# Patient Record
Sex: Female | Born: 1955 | Race: White | Hispanic: No | Marital: Married | Smoking: Former smoker
Health system: Southern US, Community
[De-identification: ages and names within clinical notes are randomized; demographics above are authoritative.]

## PROBLEM LIST (undated history)

## (undated) DIAGNOSIS — R011 Cardiac murmur, unspecified: Secondary | ICD-10-CM

## (undated) DIAGNOSIS — F410 Panic disorder [episodic paroxysmal anxiety] without agoraphobia: Secondary | ICD-10-CM

## (undated) DIAGNOSIS — N183 Chronic kidney disease, stage 3 unspecified: Secondary | ICD-10-CM

## (undated) DIAGNOSIS — M8580 Other specified disorders of bone density and structure, unspecified site: Secondary | ICD-10-CM

## (undated) DIAGNOSIS — E2740 Unspecified adrenocortical insufficiency: Principal | ICD-10-CM

## (undated) DIAGNOSIS — E785 Hyperlipidemia, unspecified: Secondary | ICD-10-CM

## (undated) DIAGNOSIS — K219 Gastro-esophageal reflux disease without esophagitis: Secondary | ICD-10-CM

## (undated) DIAGNOSIS — N182 Chronic kidney disease, stage 2 (mild): Secondary | ICD-10-CM

## (undated) DIAGNOSIS — J45909 Unspecified asthma, uncomplicated: Secondary | ICD-10-CM

## (undated) DIAGNOSIS — J449 Chronic obstructive pulmonary disease, unspecified: Secondary | ICD-10-CM

## (undated) DIAGNOSIS — I38 Endocarditis, valve unspecified: Secondary | ICD-10-CM

## (undated) DIAGNOSIS — F329 Major depressive disorder, single episode, unspecified: Secondary | ICD-10-CM

## (undated) DIAGNOSIS — N3281 Overactive bladder: Secondary | ICD-10-CM

## (undated) DIAGNOSIS — D649 Anemia, unspecified: Secondary | ICD-10-CM

## (undated) DIAGNOSIS — I10 Essential (primary) hypertension: Secondary | ICD-10-CM

## (undated) DIAGNOSIS — Q2730 Arteriovenous malformation, site unspecified: Secondary | ICD-10-CM

## (undated) DIAGNOSIS — F419 Anxiety disorder, unspecified: Secondary | ICD-10-CM

## (undated) DIAGNOSIS — F32A Depression, unspecified: Secondary | ICD-10-CM

## (undated) DIAGNOSIS — E7800 Pure hypercholesterolemia, unspecified: Secondary | ICD-10-CM

## (undated) DIAGNOSIS — K5792 Diverticulitis of intestine, part unspecified, without perforation or abscess without bleeding: Secondary | ICD-10-CM

## (undated) DIAGNOSIS — K449 Diaphragmatic hernia without obstruction or gangrene: Secondary | ICD-10-CM

## (undated) HISTORY — DX: Overactive bladder: N32.81

## (undated) HISTORY — DX: Chronic kidney disease, stage 3 (moderate): N18.3

## (undated) HISTORY — DX: Anxiety disorder, unspecified: F41.9

## (undated) HISTORY — DX: Panic disorder (episodic paroxysmal anxiety): F41.0

## (undated) HISTORY — DX: Depression, unspecified: F32.A

## (undated) HISTORY — DX: Other specified disorders of bone density and structure, unspecified site: M85.80

## (undated) HISTORY — DX: Cardiac murmur, unspecified: R01.1

## (undated) HISTORY — DX: Anemia, unspecified: D64.9

## (undated) HISTORY — DX: Major depressive disorder, single episode, unspecified: F32.9

## (undated) HISTORY — DX: Chronic kidney disease, stage 3 unspecified: N18.30

## (undated) HISTORY — DX: Unspecified asthma, uncomplicated: J45.909

## (undated) HISTORY — DX: Endocarditis, valve unspecified: I38

## (undated) HISTORY — DX: Diverticulitis of intestine, part unspecified, without perforation or abscess without bleeding: K57.92

## (undated) HISTORY — DX: Gastro-esophageal reflux disease without esophagitis: K21.9

## (undated) HISTORY — DX: Unspecified adrenocortical insufficiency: E27.40

## (undated) HISTORY — DX: Arteriovenous malformation, site unspecified: Q27.30

## (undated) HISTORY — DX: Diaphragmatic hernia without obstruction or gangrene: K44.9

## (undated) HISTORY — DX: Hyperlipidemia, unspecified: E78.5

## (undated) HISTORY — DX: Chronic kidney disease, stage 2 (mild): N18.2

## (undated) HISTORY — DX: Essential (primary) hypertension: I10

## (undated) HISTORY — DX: Pure hypercholesterolemia, unspecified: E78.00

---

## 1979-01-10 HISTORY — PX: TUBAL LIGATION: SHX77

## 2002-09-17 ENCOUNTER — Encounter: Payer: Self-pay | Admitting: Emergency Medicine

## 2002-09-17 ENCOUNTER — Emergency Department (HOSPITAL_COMMUNITY): Admission: EM | Admit: 2002-09-17 | Discharge: 2002-09-17 | Payer: Self-pay | Admitting: Emergency Medicine

## 2003-06-14 ENCOUNTER — Emergency Department (HOSPITAL_COMMUNITY): Admission: EM | Admit: 2003-06-14 | Discharge: 2003-06-14 | Payer: Self-pay | Admitting: Emergency Medicine

## 2003-10-13 ENCOUNTER — Emergency Department (HOSPITAL_COMMUNITY): Admission: EM | Admit: 2003-10-13 | Discharge: 2003-10-13 | Payer: Self-pay | Admitting: Emergency Medicine

## 2003-11-29 ENCOUNTER — Emergency Department: Payer: Self-pay | Admitting: Emergency Medicine

## 2003-12-03 ENCOUNTER — Emergency Department: Payer: Self-pay | Admitting: Emergency Medicine

## 2003-12-03 ENCOUNTER — Other Ambulatory Visit: Payer: Self-pay

## 2003-12-12 ENCOUNTER — Emergency Department: Payer: Self-pay | Admitting: Emergency Medicine

## 2004-01-10 HISTORY — PX: APPENDECTOMY: SHX54

## 2004-02-10 ENCOUNTER — Ambulatory Visit: Payer: Self-pay

## 2004-07-05 ENCOUNTER — Other Ambulatory Visit: Payer: Self-pay

## 2004-07-08 ENCOUNTER — Inpatient Hospital Stay: Payer: Self-pay | Admitting: Obstetrics and Gynecology

## 2004-07-15 ENCOUNTER — Ambulatory Visit: Payer: Self-pay | Admitting: Obstetrics and Gynecology

## 2004-07-16 ENCOUNTER — Inpatient Hospital Stay: Payer: Self-pay | Admitting: Obstetrics and Gynecology

## 2005-01-09 HISTORY — PX: ABDOMINAL HYSTERECTOMY: SHX81

## 2005-01-17 ENCOUNTER — Emergency Department: Payer: Self-pay | Admitting: Unknown Physician Specialty

## 2005-02-06 ENCOUNTER — Emergency Department: Payer: Self-pay | Admitting: General Practice

## 2006-01-01 IMAGING — CR DG CHEST 2V
1 series · 2 of 2 positions shown · non-contrast
Comparison: none

REASON FOR EXAM: v77.99 hx tobacco
COMMENTS:

PROCEDURE:     DXR - DXR CHEST PA (OR AP) AND LATERAL  - July 05, 2004 [DATE]
RESULT:     The current exam is compared to prior exam of 07-01-02.  The lung
fields are clear. The heart, mediastinal and osseous structures show no
significant abnormalities.

[Series 454: postero_anterior · 0.11mm/px · 2 of 2 slices shown]
[im 1/2]
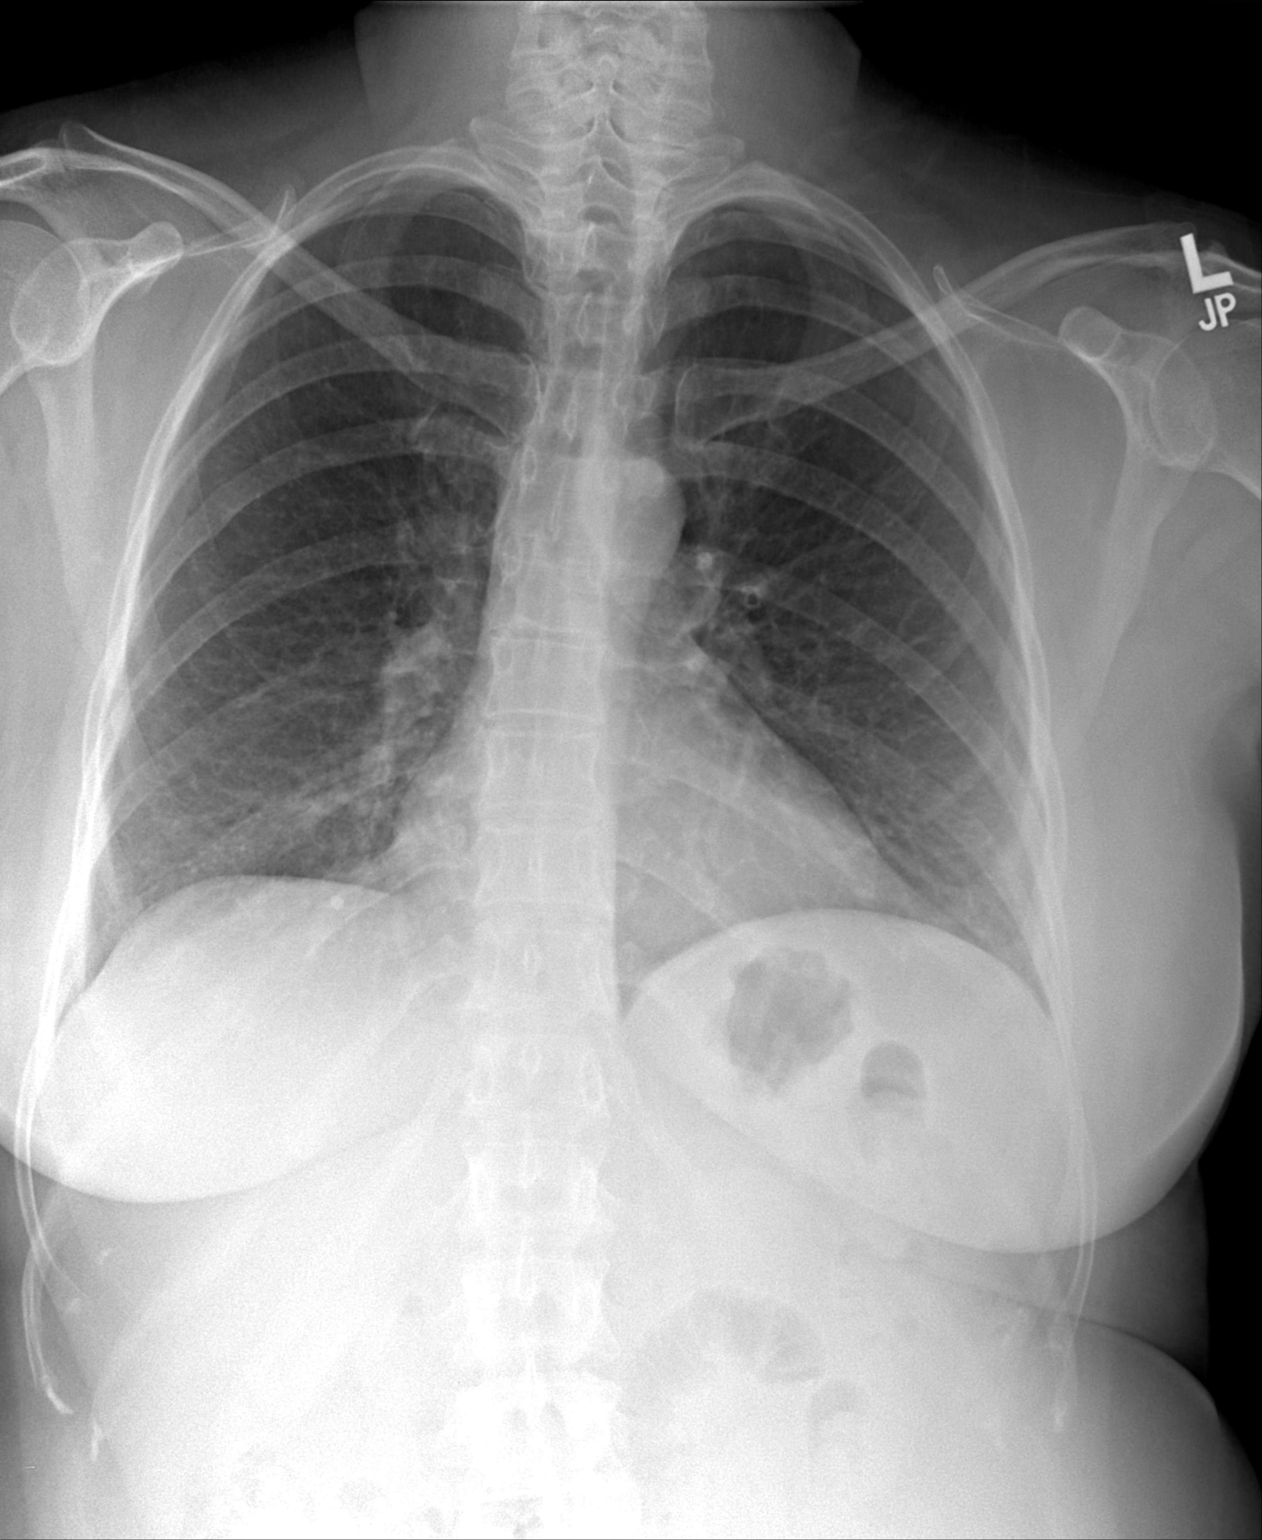
[im 2/2]
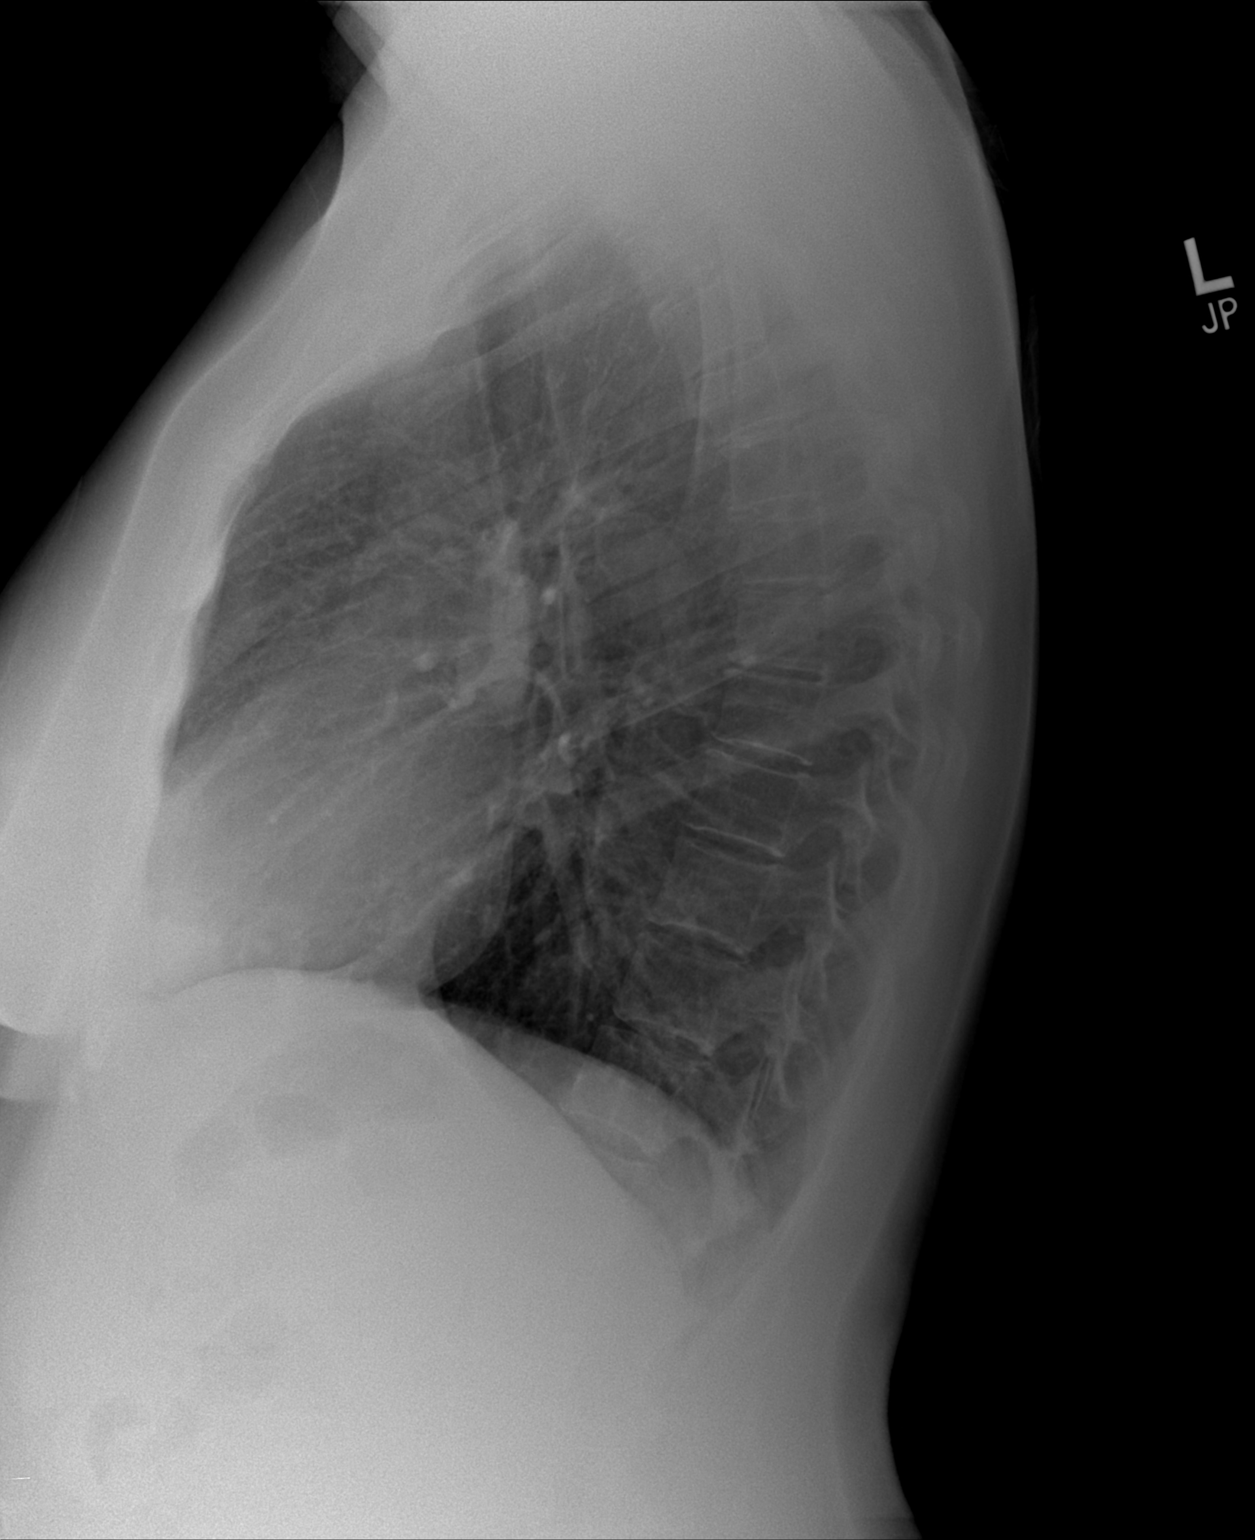

[2 of 2 positions shown; findings below may reference images not displayed]

IMPRESSION: 1)No acute changes are identified.

## 2006-06-07 ENCOUNTER — Ambulatory Visit: Payer: Self-pay | Admitting: Internal Medicine

## 2006-06-23 ENCOUNTER — Other Ambulatory Visit: Payer: Self-pay

## 2006-06-23 ENCOUNTER — Emergency Department: Payer: Self-pay | Admitting: Emergency Medicine

## 2006-08-23 ENCOUNTER — Inpatient Hospital Stay: Payer: Self-pay | Admitting: Internal Medicine

## 2006-08-24 ENCOUNTER — Other Ambulatory Visit: Payer: Self-pay

## 2006-09-04 ENCOUNTER — Ambulatory Visit: Payer: Self-pay | Admitting: Gastroenterology

## 2006-09-07 ENCOUNTER — Ambulatory Visit: Payer: Self-pay | Admitting: Gastroenterology

## 2006-09-27 ENCOUNTER — Ambulatory Visit: Payer: Self-pay | Admitting: Gastroenterology

## 2006-10-28 ENCOUNTER — Emergency Department: Payer: Self-pay | Admitting: Emergency Medicine

## 2007-08-05 ENCOUNTER — Emergency Department: Payer: Self-pay | Admitting: Emergency Medicine

## 2007-08-05 ENCOUNTER — Other Ambulatory Visit: Payer: Self-pay

## 2007-08-06 ENCOUNTER — Emergency Department: Payer: Self-pay | Admitting: Emergency Medicine

## 2008-05-01 ENCOUNTER — Emergency Department: Payer: Self-pay | Admitting: Emergency Medicine

## 2008-05-04 ENCOUNTER — Emergency Department: Payer: Self-pay | Admitting: Emergency Medicine

## 2008-05-09 ENCOUNTER — Emergency Department: Payer: Self-pay | Admitting: Emergency Medicine

## 2008-06-18 ENCOUNTER — Emergency Department: Payer: Self-pay | Admitting: Emergency Medicine

## 2008-10-23 ENCOUNTER — Emergency Department: Payer: Self-pay | Admitting: Emergency Medicine

## 2009-09-10 ENCOUNTER — Emergency Department: Payer: Self-pay | Admitting: Unknown Physician Specialty

## 2009-12-27 ENCOUNTER — Emergency Department: Payer: Self-pay | Admitting: Emergency Medicine

## 2010-04-21 IMAGING — CT CT ABD-PELV W/ CM
1 of 2 series · 15 of 32 positions shown, 19 images · IV contrast (isovue)
Comparison: 05/09/2008

REASON FOR EXAM: (1) abd pain, hx of diverticulitis; (2) abd pain, hx of
diverticulitis
COMMENTS:

PROCEDURE:     CT  - CT ABDOMEN / PELVIS  W  - October 23, 2008  [DATE]
RESULT:     History: Abdominal pain
TECHNIQUE: Multiple axial images of the abdomen and pelvis were performed
from the lung bases to the pubic symphysis, with p.o. contrast and with 100
ml of Isovue 370 intravenous contrast.

[Series 2: abdomen · axial · 0.67mm/px · z∈[-1217,-857]mm · 15 of 80 slices shown, 19 images]
[im 4/80  soft-tissue]
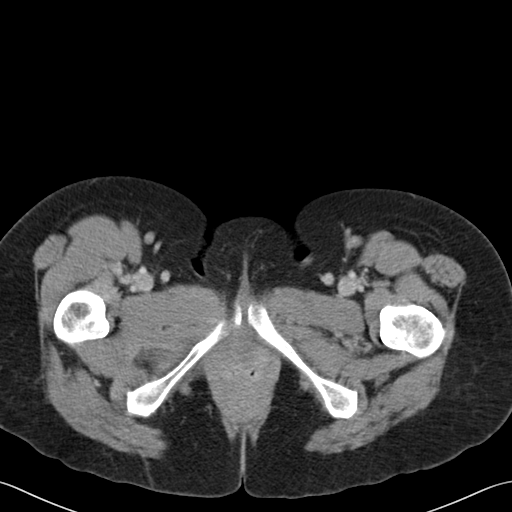
[im 4/80  bone]
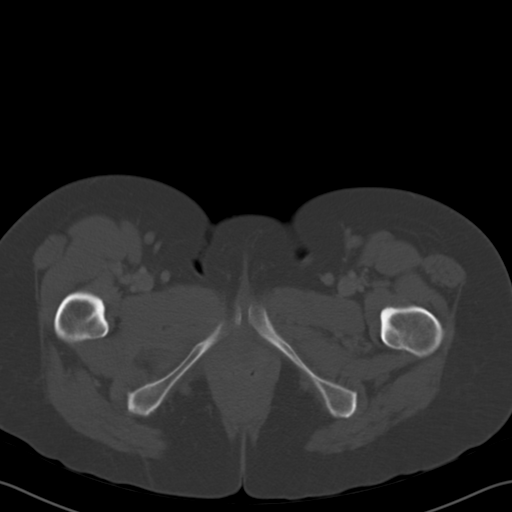
[im 10/80  soft-tissue]
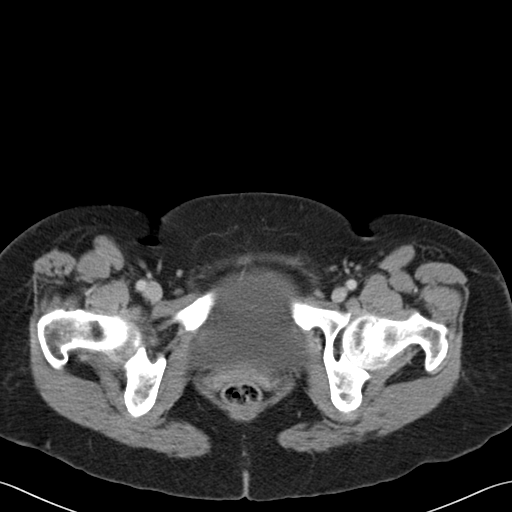
[im 17/80  soft-tissue]
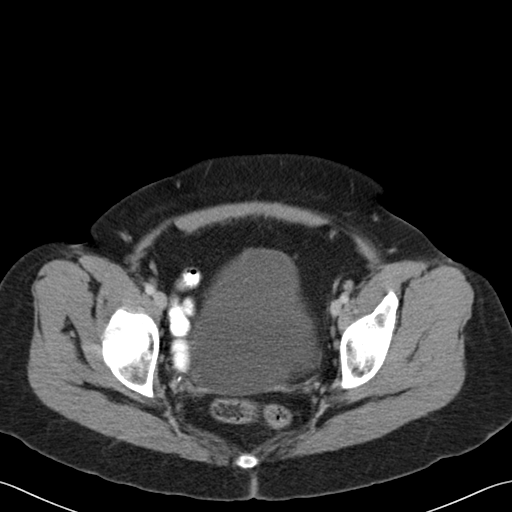
[im 24/80  soft-tissue]
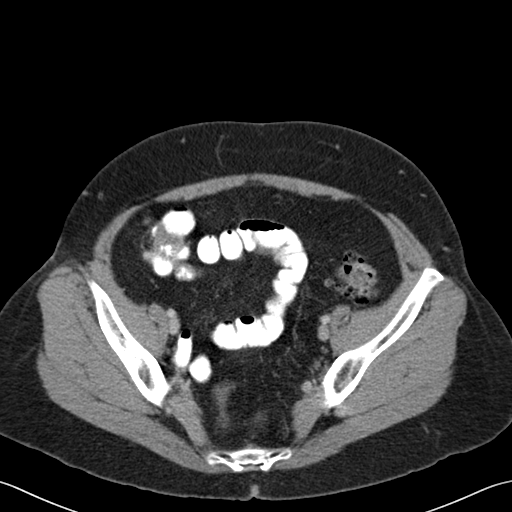
[im 27/80  soft-tissue]
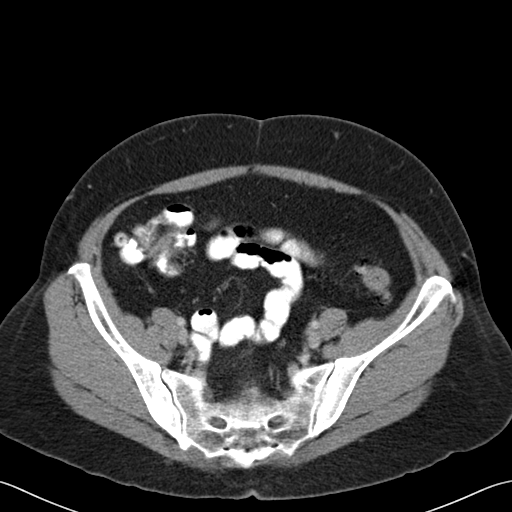
[im 33/80  soft-tissue]
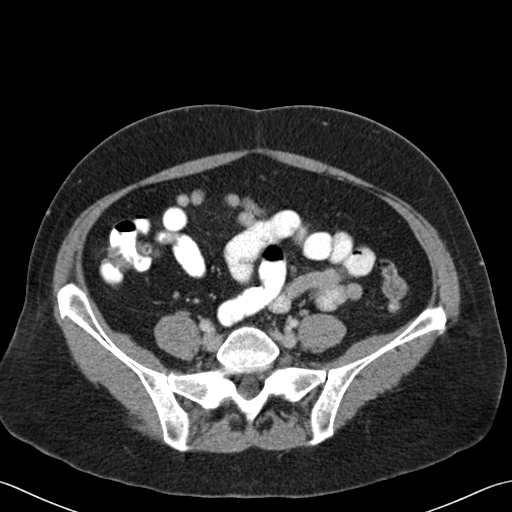
[im 40/80  soft-tissue]
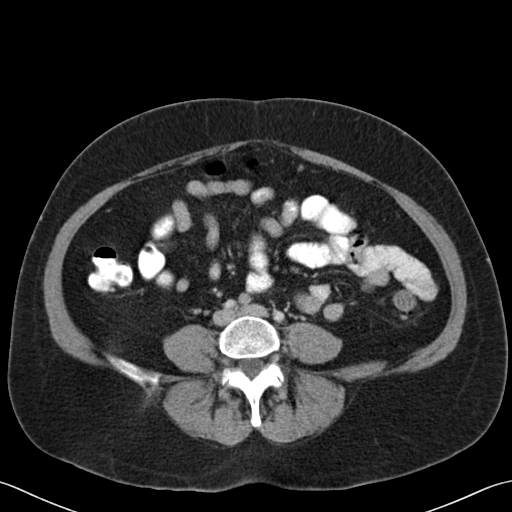
[im 47/80  soft-tissue]
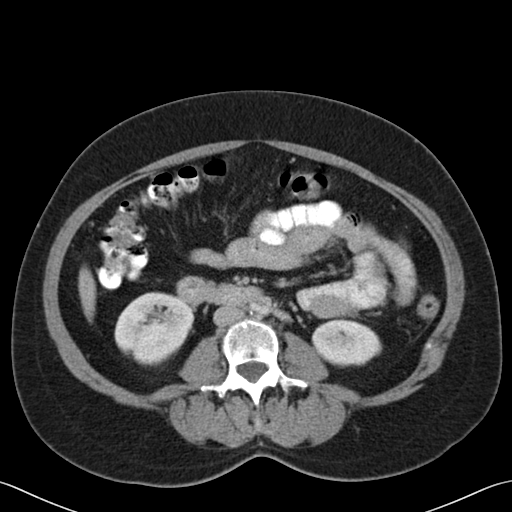
[im 53/80  soft-tissue]
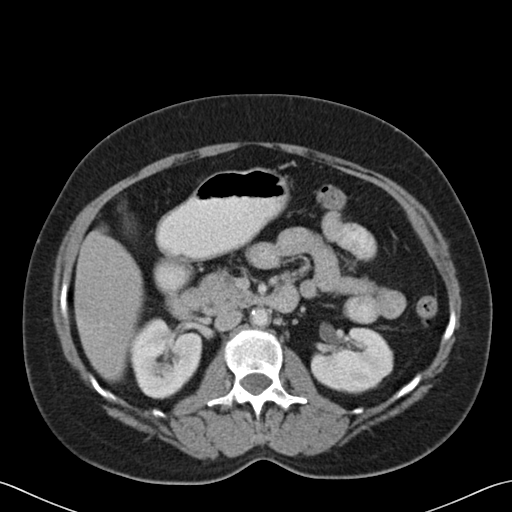
[im 53/80  bone]
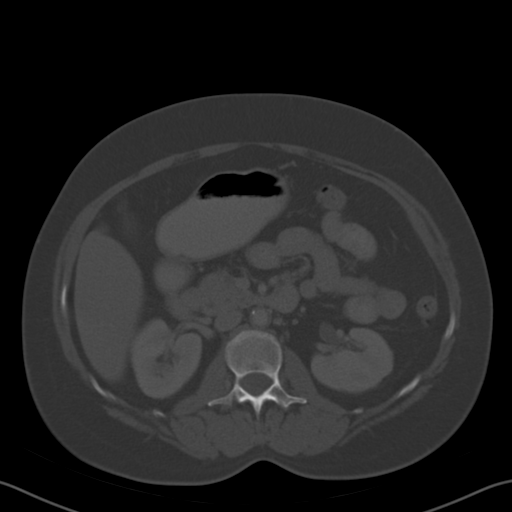
[im 56/80  soft-tissue]
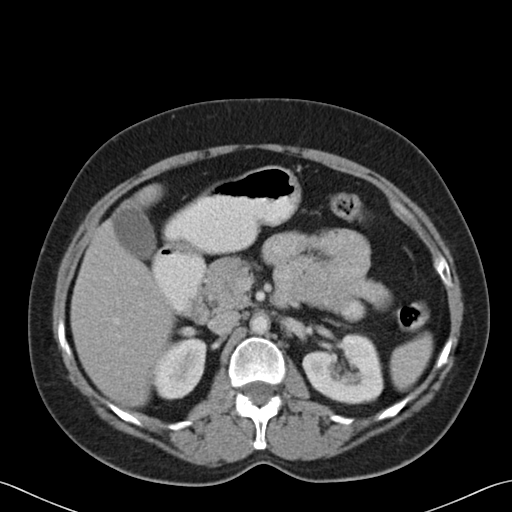
[im 63/80  soft-tissue]
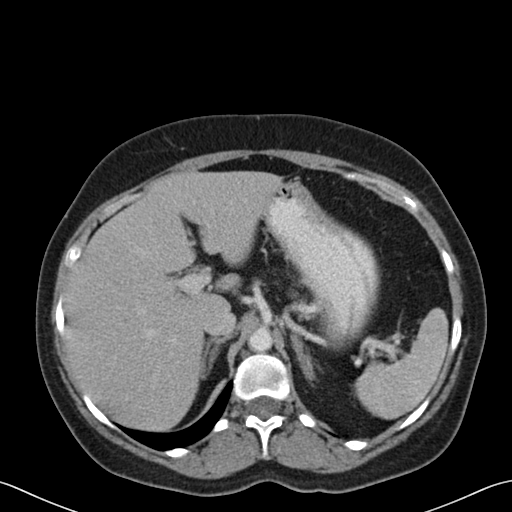
[im 66/80  lung]
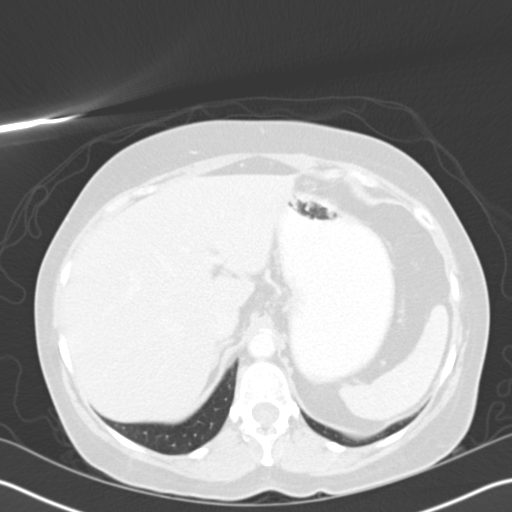
[im 70/80  soft-tissue]
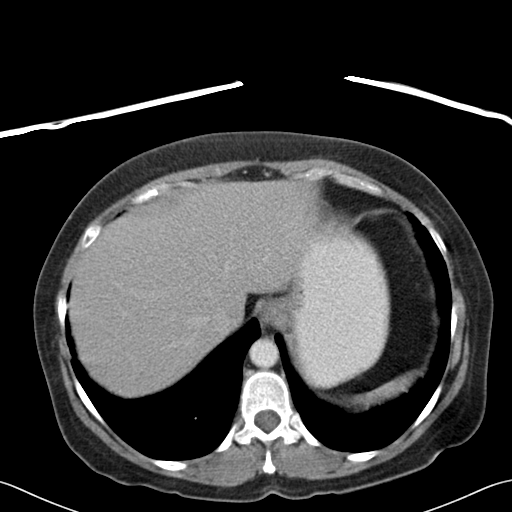
[im 70/80  lung]
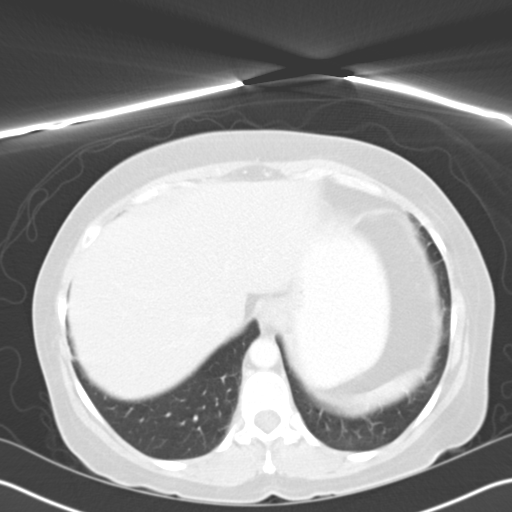
[im 73/80  lung]
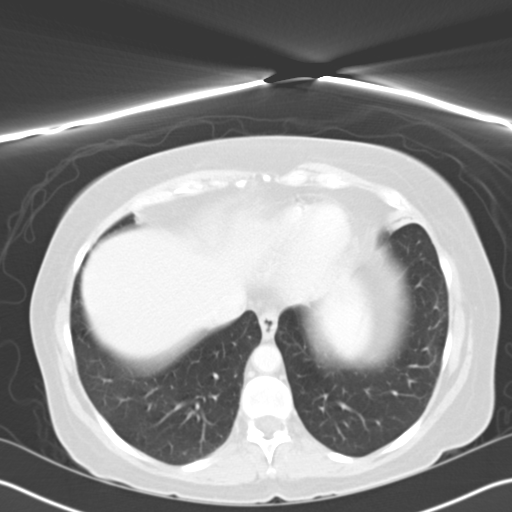
[im 76/80  soft-tissue]
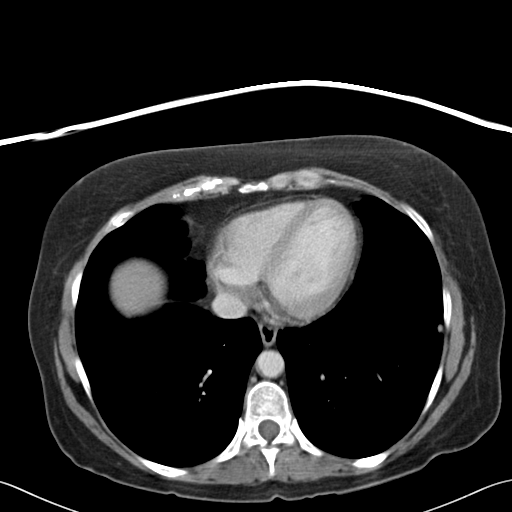
[im 76/80  lung]
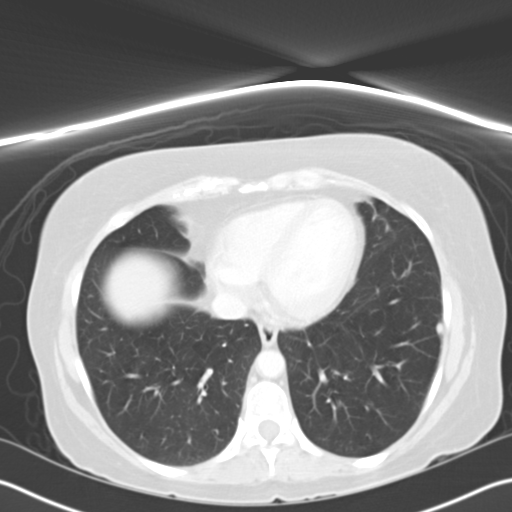

[15 of 32 positions shown; findings below may reference images not displayed]

FINDINGS: The lung bases are clear. There is no pneumothorax. The heart size is
normal.

The liver demonstrates no focal abnormality. There is no intrahepatic or
extrahepatic biliary ductal dilatation. The gallbladder is unremarkable. The
spleen demonstrates no focal abnormality. The kidneys, adrenal glands, and
pancreas are normal. The bladder is unremarkable.

The stomach, duodenum, small intestine, and large intestine demonstrate no
contrast extravasation or dilatation. There is diverticulosis of the
descending and sigmoid colon. There is a small area of subtle pericolonic
inflammatory fat stranding involving the mid descending colon. There is no
focal fluid collection to suggest an abscess. There is no pneumoperitoneum,
pneumatosis, or portal venous gas. There is no abdominal or pelvic free
fluid. There is no lymphadenopathy.

The abdominal aorta is normal in caliber with atherosclerosis.

The osseous structures are unremarkable.
IMPRESSION: Diverticulosis of the descending and sigmoid colon with a small area of
pericolonic fat stranding involving the mid descending colon most concerning
for mild diverticulitis. There is no evidence of a peridiverticular abscess.

## 2011-01-10 HISTORY — PX: COLONOSCOPY: SHX174

## 2011-01-10 HISTORY — PX: UPPER GI ENDOSCOPY: SHX6162

## 2011-01-10 LAB — HM COLONOSCOPY

## 2011-08-27 ENCOUNTER — Emergency Department: Payer: Self-pay | Admitting: Emergency Medicine

## 2011-08-27 LAB — BASIC METABOLIC PANEL
Anion Gap: 4 — ABNORMAL LOW (ref 7–16)
BUN: 17 mg/dL (ref 7–18)
Calcium, Total: 9.4 mg/dL (ref 8.5–10.1)
Chloride: 104 mmol/L (ref 98–107)
Co2: 30 mmol/L (ref 21–32)
Creatinine: 1.07 mg/dL (ref 0.60–1.30)
EGFR (African American): >60
EGFR (Non-African Amer.): 59 — ABNORMAL LOW
Glucose: 91 mg/dL (ref 65–99)
Osmolality: 277 (ref 275–301)
Potassium: 4 mmol/L (ref 3.5–5.1)
Sodium: 138 mmol/L (ref 136–145)

## 2011-08-27 LAB — CBC
HCT: 47.9 % — ABNORMAL HIGH (ref 35.0–47.0)
HGB: 15.2 g/dL (ref 12.0–16.0)
MCH: 28.3 pg (ref 26.0–34.0)
MCHC: 31.8 g/dL — ABNORMAL LOW (ref 32.0–36.0)
MCV: 89 fL (ref 80–100)
Platelet: 327 10*3/uL (ref 150–440)
RBC: 5.38 10*6/uL — ABNORMAL HIGH (ref 3.80–5.20)
RDW: 13.6 % (ref 11.5–14.5)
WBC: 11 10*3/uL (ref 3.6–11.0)

## 2011-08-27 LAB — LIPASE, BLOOD: Lipase: 268 U/L (ref 73–393)

## 2011-12-27 ENCOUNTER — Emergency Department: Payer: Self-pay | Admitting: Internal Medicine

## 2011-12-27 LAB — COMPREHENSIVE METABOLIC PANEL
Albumin: 3.2 g/dL — ABNORMAL LOW (ref 3.4–5.0)
Alkaline Phosphatase: 101 U/L (ref 50–136)
Anion Gap: 9 (ref 7–16)
BUN: 9 mg/dL (ref 7–18)
Bilirubin,Total: 0.3 mg/dL (ref 0.2–1.0)
Calcium, Total: 8.5 mg/dL (ref 8.5–10.1)
Chloride: 105 mmol/L (ref 98–107)
Co2: 23 mmol/L (ref 21–32)
Creatinine: 0.91 mg/dL (ref 0.60–1.30)
EGFR (African American): >60
EGFR (Non-African Amer.): >60
Glucose: 92 mg/dL (ref 65–99)
Osmolality: 272 (ref 275–301)
Potassium: 3.8 mmol/L (ref 3.5–5.1)
SGOT(AST): 25 U/L (ref 15–37)
SGPT (ALT): 22 U/L (ref 12–78)
Sodium: 137 mmol/L (ref 136–145)
Total Protein: 7.7 g/dL (ref 6.4–8.2)

## 2011-12-27 LAB — URINALYSIS, COMPLETE
Bacteria: NONE SEEN
Bilirubin,UR: NEGATIVE
Glucose,UR: NEGATIVE mg/dL (ref 0–75)
Ketone: NEGATIVE
Leukocyte Esterase: NEGATIVE
Nitrite: NEGATIVE
Ph: 6 (ref 4.5–8.0)
Protein: NEGATIVE
RBC,UR: <1 /HPF (ref 0–5)
Specific Gravity: 1.008 (ref 1.003–1.030)
Squamous Epithelial: 11
WBC UR: 2 /HPF (ref 0–5)

## 2011-12-27 LAB — CBC
HCT: 42.7 % (ref 35.0–47.0)
HGB: 13.8 g/dL (ref 12.0–16.0)
MCH: 28 pg (ref 26.0–34.0)
MCHC: 32.3 g/dL (ref 32.0–36.0)
MCV: 87 fL (ref 80–100)
Platelet: 248 10*3/uL (ref 150–440)
RBC: 4.93 10*6/uL (ref 3.80–5.20)
RDW: 13.9 % (ref 11.5–14.5)
WBC: 5.5 10*3/uL (ref 3.6–11.0)

## 2011-12-27 LAB — LIPASE, BLOOD: Lipase: 204 U/L (ref 73–393)

## 2011-12-29 LAB — COMPREHENSIVE METABOLIC PANEL
Albumin: 3.4 g/dL (ref 3.4–5.0)
Alkaline Phosphatase: 104 U/L (ref 50–136)
Anion Gap: 7 (ref 7–16)
BUN: 13 mg/dL (ref 7–18)
Bilirubin,Total: 0.3 mg/dL (ref 0.2–1.0)
Calcium, Total: 8.6 mg/dL (ref 8.5–10.1)
Chloride: 103 mmol/L (ref 98–107)
Co2: 29 mmol/L (ref 21–32)
Creatinine: 1.07 mg/dL (ref 0.60–1.30)
EGFR (African American): >60
EGFR (Non-African Amer.): 59 — ABNORMAL LOW
Glucose: 88 mg/dL (ref 65–99)
Osmolality: 277 (ref 275–301)
Potassium: 3.9 mmol/L (ref 3.5–5.1)
SGOT(AST): 12 U/L — ABNORMAL LOW (ref 15–37)
SGPT (ALT): 17 U/L (ref 12–78)
Sodium: 139 mmol/L (ref 136–145)
Total Protein: 8 g/dL (ref 6.4–8.2)

## 2011-12-29 LAB — CBC
HCT: 44.4 % (ref 35.0–47.0)
HGB: 15.1 g/dL (ref 12.0–16.0)
MCH: 29.4 pg (ref 26.0–34.0)
MCHC: 33.9 g/dL (ref 32.0–36.0)
MCV: 87 fL (ref 80–100)
Platelet: 250 10*3/uL (ref 150–440)
RBC: 5.12 10*6/uL (ref 3.80–5.20)
RDW: 13.6 % (ref 11.5–14.5)
WBC: 8.6 10*3/uL (ref 3.6–11.0)

## 2011-12-29 LAB — LIPASE, BLOOD: Lipase: 213 U/L (ref 73–393)

## 2011-12-30 ENCOUNTER — Inpatient Hospital Stay: Payer: Self-pay | Admitting: Internal Medicine

## 2011-12-30 LAB — RAPID INFLUENZA A&B ANTIGENS

## 2011-12-30 LAB — CLOSTRIDIUM DIFFICILE BY PCR

## 2011-12-31 LAB — CBC WITH DIFFERENTIAL/PLATELET
Basophil #: 0 10*3/uL (ref 0.0–0.1)
Basophil %: 0.3 %
Eosinophil #: 0 10*3/uL (ref 0.0–0.7)
Eosinophil %: 0 %
HCT: 42 % (ref 35.0–47.0)
HGB: 14 g/dL (ref 12.0–16.0)
Lymphocyte #: 1.3 10*3/uL (ref 1.0–3.6)
Lymphocyte %: 12.9 %
MCH: 28.8 pg (ref 26.0–34.0)
MCHC: 33.3 g/dL (ref 32.0–36.0)
MCV: 86 fL (ref 80–100)
Monocyte #: 0.4 x10 3/mm (ref 0.2–0.9)
Monocyte %: 4 %
Neutrophil #: 8.3 10*3/uL — ABNORMAL HIGH (ref 1.4–6.5)
Neutrophil %: 82.8 %
Platelet: 256 10*3/uL (ref 150–440)
RBC: 4.86 10*6/uL (ref 3.80–5.20)
RDW: 13.7 % (ref 11.5–14.5)
WBC: 10 10*3/uL (ref 3.6–11.0)

## 2012-01-02 LAB — CULTURE, BLOOD (SINGLE)

## 2012-01-04 LAB — CULTURE, BLOOD (SINGLE)

## 2012-07-21 ENCOUNTER — Emergency Department: Payer: Self-pay | Admitting: Emergency Medicine

## 2012-07-21 LAB — URINALYSIS, COMPLETE
Bacteria: NONE SEEN
Bilirubin,UR: NEGATIVE
Blood: NEGATIVE
Glucose,UR: NEGATIVE mg/dL (ref 0–75)
Ketone: NEGATIVE
Leukocyte Esterase: NEGATIVE
Nitrite: NEGATIVE
Ph: 5 (ref 4.5–8.0)
Protein: NEGATIVE
RBC,UR: 1 /HPF (ref 0–5)
Specific Gravity: 1.009 (ref 1.003–1.030)
Squamous Epithelial: 1
WBC UR: <1 /HPF (ref 0–5)

## 2012-07-21 LAB — CBC
HCT: 44.5 % (ref 35.0–47.0)
HGB: 14.7 g/dL (ref 12.0–16.0)
MCH: 27.7 pg (ref 26.0–34.0)
MCHC: 33.1 g/dL (ref 32.0–36.0)
MCV: 84 fL (ref 80–100)
Platelet: 304 10*3/uL (ref 150–440)
RBC: 5.32 10*6/uL — ABNORMAL HIGH (ref 3.80–5.20)
RDW: 14.3 % (ref 11.5–14.5)
WBC: 9.6 10*3/uL (ref 3.6–11.0)

## 2012-07-21 LAB — COMPREHENSIVE METABOLIC PANEL
Albumin: 3.6 g/dL (ref 3.4–5.0)
Alkaline Phosphatase: 132 U/L (ref 50–136)
Anion Gap: 3 — ABNORMAL LOW (ref 7–16)
BUN: 16 mg/dL (ref 7–18)
Bilirubin,Total: 0.4 mg/dL (ref 0.2–1.0)
Calcium, Total: 9.5 mg/dL (ref 8.5–10.1)
Chloride: 106 mmol/L (ref 98–107)
Co2: 30 mmol/L (ref 21–32)
Creatinine: 1.16 mg/dL (ref 0.60–1.30)
EGFR (African American): >60
EGFR (Non-African Amer.): 53 — ABNORMAL LOW
Glucose: 87 mg/dL (ref 65–99)
Osmolality: 278 (ref 275–301)
Potassium: 4.1 mmol/L (ref 3.5–5.1)
SGOT(AST): 18 U/L (ref 15–37)
SGPT (ALT): 27 U/L (ref 12–78)
Sodium: 139 mmol/L (ref 136–145)
Total Protein: 8.3 g/dL — ABNORMAL HIGH (ref 6.4–8.2)

## 2012-07-21 LAB — LIPASE, BLOOD: Lipase: 207 U/L (ref 73–393)

## 2013-06-12 ENCOUNTER — Emergency Department: Payer: Self-pay | Admitting: Emergency Medicine

## 2013-06-30 ENCOUNTER — Ambulatory Visit: Payer: Self-pay | Admitting: Podiatry

## 2013-08-14 ENCOUNTER — Emergency Department: Payer: Self-pay | Admitting: Emergency Medicine

## 2013-08-14 LAB — COMPREHENSIVE METABOLIC PANEL
ANION GAP: 10 (ref 7–16)
Albumin: 3.6 g/dL (ref 3.4–5.0)
Alkaline Phosphatase: 110 U/L
BILIRUBIN TOTAL: 0.3 mg/dL (ref 0.2–1.0)
BUN: 16 mg/dL (ref 7–18)
CO2: 28 mmol/L (ref 21–32)
Calcium, Total: 9.9 mg/dL (ref 8.5–10.1)
Chloride: 101 mmol/L (ref 98–107)
Creatinine: 1.19 mg/dL (ref 0.60–1.30)
EGFR (African American): 59 — ABNORMAL LOW
GFR CALC NON AF AMER: 51 — AB
Glucose: 94 mg/dL (ref 65–99)
OSMOLALITY: 278 (ref 275–301)
Potassium: 4.1 mmol/L (ref 3.5–5.1)
SGOT(AST): 21 U/L (ref 15–37)
SGPT (ALT): 29 U/L
SODIUM: 139 mmol/L (ref 136–145)
TOTAL PROTEIN: 8.7 g/dL — AB (ref 6.4–8.2)

## 2013-08-14 LAB — URINALYSIS, COMPLETE
Bacteria: NONE SEEN
Bilirubin,UR: NEGATIVE
Blood: NEGATIVE
GLUCOSE, UR: NEGATIVE mg/dL (ref 0–75)
Hyaline Cast: 2
Ketone: NEGATIVE
Nitrite: NEGATIVE
PROTEIN: NEGATIVE
Ph: 6 (ref 4.5–8.0)
RBC,UR: 1 /HPF (ref 0–5)
Specific Gravity: 1.01 (ref 1.003–1.030)
Squamous Epithelial: 8
WBC UR: 1 /HPF (ref 0–5)

## 2013-08-14 LAB — CBC
HCT: 47.3 % — ABNORMAL HIGH (ref 35.0–47.0)
HGB: 14.9 g/dL (ref 12.0–16.0)
MCH: 27.7 pg (ref 26.0–34.0)
MCHC: 31.6 g/dL — ABNORMAL LOW (ref 32.0–36.0)
MCV: 88 fL (ref 80–100)
Platelet: 321 10*3/uL (ref 150–440)
RBC: 5.39 10*6/uL — ABNORMAL HIGH (ref 3.80–5.20)
RDW: 14 % (ref 11.5–14.5)
WBC: 10.8 10*3/uL (ref 3.6–11.0)

## 2013-08-14 LAB — LIPASE, BLOOD: Lipase: 237 U/L (ref 73–393)

## 2013-08-14 LAB — TROPONIN I

## 2013-08-17 ENCOUNTER — Inpatient Hospital Stay: Payer: Self-pay | Admitting: Internal Medicine

## 2013-08-17 LAB — CBC WITH DIFFERENTIAL/PLATELET
BASOS ABS: 0.1 10*3/uL (ref 0.0–0.1)
BASOS PCT: 0.7 %
EOS PCT: 2.1 %
Eosinophil #: 0.2 10*3/uL (ref 0.0–0.7)
HCT: 44.8 % (ref 35.0–47.0)
HGB: 14.4 g/dL (ref 12.0–16.0)
LYMPHS ABS: 3.2 10*3/uL (ref 1.0–3.6)
LYMPHS PCT: 26.4 %
MCH: 28.1 pg (ref 26.0–34.0)
MCHC: 32.2 g/dL (ref 32.0–36.0)
MCV: 87 fL (ref 80–100)
Monocyte #: 1 x10 3/mm — ABNORMAL HIGH (ref 0.2–0.9)
Monocyte %: 8 %
Neutrophil #: 7.5 10*3/uL — ABNORMAL HIGH (ref 1.4–6.5)
Neutrophil %: 62.8 %
Platelet: 321 10*3/uL (ref 150–440)
RBC: 5.14 10*6/uL (ref 3.80–5.20)
RDW: 13.7 % (ref 11.5–14.5)
WBC: 12 10*3/uL — AB (ref 3.6–11.0)

## 2013-08-17 LAB — COMPREHENSIVE METABOLIC PANEL
ALK PHOS: 112 U/L
Albumin: 3.5 g/dL (ref 3.4–5.0)
Anion Gap: 10 (ref 7–16)
BILIRUBIN TOTAL: 0.6 mg/dL (ref 0.2–1.0)
BUN: 10 mg/dL (ref 7–18)
Calcium, Total: 9.4 mg/dL (ref 8.5–10.1)
Chloride: 102 mmol/L (ref 98–107)
Co2: 29 mmol/L (ref 21–32)
Creatinine: 1.26 mg/dL (ref 0.60–1.30)
EGFR (African American): 55 — ABNORMAL LOW
EGFR (Non-African Amer.): 48 — ABNORMAL LOW
Glucose: 103 mg/dL — ABNORMAL HIGH (ref 65–99)
Osmolality: 281 (ref 275–301)
POTASSIUM: 3.7 mmol/L (ref 3.5–5.1)
SGOT(AST): 21 U/L (ref 15–37)
SGPT (ALT): 24 U/L
Sodium: 141 mmol/L (ref 136–145)
Total Protein: 8.6 g/dL — ABNORMAL HIGH (ref 6.4–8.2)

## 2013-08-17 LAB — URINALYSIS, COMPLETE
BLOOD: NEGATIVE
Bacteria: NONE SEEN
Bilirubin,UR: NEGATIVE
GLUCOSE, UR: NEGATIVE mg/dL (ref 0–75)
Ketone: NEGATIVE
NITRITE: NEGATIVE
PH: 5 (ref 4.5–8.0)
PROTEIN: NEGATIVE
RBC,UR: 1 /HPF (ref 0–5)
Specific Gravity: 1.015 (ref 1.003–1.030)
Squamous Epithelial: 11
WBC UR: 2 /HPF (ref 0–5)

## 2013-09-16 ENCOUNTER — Encounter: Payer: Self-pay | Admitting: *Deleted

## 2013-09-30 ENCOUNTER — Ambulatory Visit (INDEPENDENT_AMBULATORY_CARE_PROVIDER_SITE_OTHER): Payer: BC Managed Care – PPO | Admitting: General Surgery

## 2013-09-30 ENCOUNTER — Encounter: Payer: Self-pay | Admitting: General Surgery

## 2013-09-30 VITALS — BP 150/90 | HR 86 | Resp 16 | Ht <= 58 in | Wt 142.0 lb

## 2013-09-30 DIAGNOSIS — K5732 Diverticulitis of large intestine without perforation or abscess without bleeding: Secondary | ICD-10-CM

## 2013-09-30 MED ORDER — NEOMYCIN SULFATE 500 MG PO TABS: 500.0000 mg | ORAL_TABLET | ORAL | Status: DC

## 2013-09-30 MED ORDER — POLYETHYLENE GLYCOL 3350 17 GM/SCOOP PO POWD: 1.0000 | Freq: Once | ORAL | Status: DC

## 2013-09-30 MED ORDER — METRONIDAZOLE 500 MG PO TABS: 500.0000 mg | ORAL_TABLET | ORAL | Status: DC

## 2013-09-30 NOTE — Patient Instructions (Addendum)
Laparoscopic Colectomy Laparoscopic colectomy is surgery to remove part or all of the large intestine (colon). This procedure is used to treat several conditions, including:  Inflammation and infection of the colon (diverticulitis).  Tumors or masses in the colon.  Inflammatory bowel disease, such as Crohn disease or ulcerative colitis. Colectomy is an option when symptoms cannot be controlled with medicines.  Bleeding from the colon that cannot be controlled by another method.  Blockage or obstruction of the colon. LET [facility] CARE PROVIDER KNOW ABOUT:  Any allergies you have.  All medicines you are taking, including vitamins, herbs, eye drops, creams, and over-the-counter medicines.  Previous problems you or members of your family have had with the use of anesthetics.  Any blood disorders you have.  Previous surgeries you have had.  Medical conditions you have. RISKS AND COMPLICATIONS Generally, this is a safe procedure. However, as with any procedure, complications can occur. Possible complications include:  Infection.  Bleeding.  Damage to other organs.  Leaking from where the colon was sewn together.  Future blockage of the small intestines from scar tissue. Another surgery may be needed to repair this. In some cases, complications such as damage to other organs or excessive bleeding may require the surgeon to convert from a laparoscopic procedure to an open procedure. This involves making a larger incision in the abdomen to perform the procedure. BEFORE THE PROCEDURE  Ask your health care provider about changing or stopping any regular medicines.  You may be prescribed an oral bowel prep. This involves drinking a large amount of medicated liquid, starting the day before your surgery. The liquid will cause you to have multiple loose stools until your stool is almost clear or light green. This cleans out your colon in preparation for the surgery.  Do not eat or  drink anything else once you have started the bowel prep, unless your health care provider tells you it is safe to do so.  You may also be given antibiotic pills to clean out your colon of bacteria. Be sure to follow the directions carefully and take the medicine at the correct time. PROCEDURE   Small monitors will be put on your body. They are used to check your heart, blood pressure, and oxygen level.  An IV access tube will be put into one of your veins. Medicine will be able to flow directly into your body through this IV tube.  You might be given a medicine to help you relax (sedative).  You will be given a medicine to make you sleep through the procedure (general anesthetic). A breathing tube may be placed into your lungs during the procedure.  A thin, flexible tube (catheter) will be placed into your bladder to collect urine.  A tube may be put in through your nose. It is called a nasogastric tube. It is used to remove stomach fluids after surgery until the intestines start working again.  Your abdomen will be filled with air so that it expands. This gives the surgeon more room to operate and makes your organs easier to see.  Several small cuts (incisions) are made in your abdomen.  A thin, lighted tube with a tiny camera on the end (laparoscope) is put through one of the small incisions. The camera on the laparoscope sends a picture to a TV screen in the operating room. This gives the surgeon a good view inside your abdomen.  Hollow tubes are put through the other small incisions in your abdomen. The tools  needed for the procedure are put through these tubes.  Clamps or staples are put on both ends of the diseased part of the colon.  The part of the intestine between the clamps or staples is removed.  If possible, the ends of the healthy colon that remain will be stitched or stapled together to allow your body to expel waste (stool).  Sometimes, the remaining colon cannot be  stitched back together. If this is the case, a colostomy is needed. For a colostomy:  An opening (stoma) to the outside of your body is made through the abdomen.  The end of the colon is brought to the opening. It is stitched to the skin.  A bag is attached to the opening. Stool will drain into this bag. The bag is removable.  The colostomy can be temporary or permanent.  The incisions from the colectomy are closed with stitches or staples. AFTER THE PROCEDURE  You will be monitored closely in a recovery area until you are stable and doing well. You will then be moved to a regular hospital room.  You will need to receive fluids through an IV tube until your bowel function has returned. This may take 1-3 days. Once your bowels are working again, you will be started on clear liquids and then advanced to solid food as tolerated.  You will be given pain medicines to control your pain. Document Released: 03/18/2002 Document Revised: 10/16/2012 Document Reviewed: 08/07/2012 [facility] Patient Information 2015 [place], [place]. This information is not intended to replace advice given to you by your health care provider. Make sure you discuss any questions you have with your health care provider.  Patient is scheduled for surgery at [facility] on 10/17/13. She will pre admit by phone on 10/07/13. She has been given instructions for bowel prep to start on 10/16/13. Miralax, Flagyl, and Neomycin prescriptions have been sent into her pharmacy. She is aware of dates and all instructions.

## 2013-09-30 NOTE — Progress Notes (Signed)
Patient ID: Kylie [name], female   DOB: [birth year], 58 y.o.   MRN: [id number]  Chief Complaint  Patient presents with  . Abdominal Pain    HPI Kylie [name] is a 58 y.o. female.  Here today for recurrent diverticulitis and possible surgery. She just finished antibiotics last month. She has been admitted several times for diverticulitis. She has had pain, nausea, vomiting and diarrhea this week as well. She states she has had 3 or 4 flare ups this year. Last year she had 3-4 flare ups and admitted at least once. When she has the pain it is left abdomen that radiates flank area. She has diarrhea, black stools and is weak.  She has been hospitalized 2-3 times over the past 3 years and has had several flare ups each year.   CT scan was in August 2015. She does say that her mother had colon cancer. She has had colonoscopy 2 yrs ago.   HPI  Past Medical History  Diagnosis Date  . Diverticulitis   . Hyperlipidemia   . GERD (gastroesophageal reflux disease)   . Hiatal hernia   . Asthma   . Murmur     Past Surgical History  Procedure Laterality Date  . Tubal ligation  1981  . Abdominal hysterectomy  2007  . Colonoscopy  2013    Dr Wandalee Ferdinand at [facility]  . Upper gi endoscopy  2013    Dr Wandalee Ferdinand at [facility]  . Appendectomy      Family History  Problem Relation Age of Onset  . Cancer Mother     colon    Social History History  Substance Use Topics  . Smoking status: Former Smoker -- 2.00 packs/day for 25 years    Types: Cigarettes    Quit date: 01/10/2011  . Smokeless tobacco: Not on file  . Alcohol Use: No    Allergies  Allergen Reactions  . Ciprofloxacin Hives  . Ivp Dye [Iodinated Diagnostic Agents] Other (See Comments)    shock  . Other Other (See Comments)    Myobetria/ dizzness  . Percocet [Oxycodone-Acetaminophen]   . Premarin [Conjugated Estrogens]   . Sulfa Antibiotics Hives  . Tequin [Gatifloxacin] Hives  . Latex Rash    Current Outpatient  Prescriptions  Medication Sig Dispense Refill  . Albuterol Sulfate (PROAIR HFA IN) Inhale into the lungs 3 (three) times daily.      Marland Kitchen ALPRAZolam (XANAX) 1 MG tablet Take 1 mg by mouth 3 (three) times daily.       . CRESTOR 10 MG tablet Take 10 mg by mouth daily.       Marland Kitchen diltiazem (CARDIZEM) 120 MG tablet Take 120 mg by mouth 2 (two) times daily.       . montelukast (SINGULAIR) 10 MG tablet Take 10 mg by mouth at bedtime.       . metroNIDAZOLE (FLAGYL) 500 MG tablet Take 1 tablet (500 mg total) by mouth See admin instructions. Take one  tablet at 6pm, then take one  tablet at 11 pm.  2 tablet  0  . neomycin (MYCIFRADIN) 500 MG tablet Take 1 tablet (500 mg total) by mouth See admin instructions. Take Two 500 mg tablets at 6 pm, then take Two 500 mg tablets at 11 pm.  4 tablet  0  . polyethylene glycol powder (GLYCOLAX/MIRALAX) powder Take 255 g by mouth once.  255 g  0   No current facility-administered medications for this visit.  Review of Systems Review of Systems  Constitutional: Negative.   Cardiovascular: Negative.   Gastrointestinal: Positive for nausea, vomiting and diarrhea. Negative for blood in stool.    Blood pressure 150/90, pulse 86, resp. rate 16, height  (1.448 m), weight 142 lb (64.411 kg).  Physical Exam Physical Exam  Constitutional: She is oriented to person, place, and time. She appears well-developed and well-nourished.  Eyes: Conjunctivae are normal. No scleral icterus.  Neck: Neck supple.  Cardiovascular: Normal rate, regular rhythm and normal heart sounds.   Pulmonary/Chest: Effort normal and breath sounds normal.  Abdominal: Soft. Normal appearance. There is no hepatomegaly. There is tenderness in the left lower quadrant. There is no guarding. No hernia.    Lymphadenopathy:    She has no cervical adenopathy.  Neurological: She is alert and oriented to person, place, and time.  Skin: Skin is warm and dry.    Data Reviewed Office notes  and labs reviewed. CT scan reviewed. Mild diverticulitis noted  Assessment    History of recurring focal diverticulitis involving the descending colon-sigmoid area. Although this is first time i am seeing her she has had documented episodes by ER, office and hospital admission.      Plan    Risk and benefits discussed in detail regarding surgery. Resection of involved area would need mobilization of splenic flexure with additional risk of injury to spleen. PT understands all of this and is willing to proceed..      Patient is scheduled for surgery at [facility] on 10/17/13. She will pre admit by phone on 10/07/13. She has been given instructions for bowel prep to start on 10/16/13. Miralax, Flagyl, and Neomycin prescriptions have been sent into her pharmacy. She is aware of dates and all instructions.    [name],[name] 09/30/2013, 8:20 PM

## 2013-10-01 ENCOUNTER — Other Ambulatory Visit: Payer: Self-pay | Admitting: General Surgery

## 2013-10-01 DIAGNOSIS — K5732 Diverticulitis of large intestine without perforation or abscess without bleeding: Secondary | ICD-10-CM

## 2013-10-13 ENCOUNTER — Telehealth: Payer: Self-pay | Admitting: *Deleted

## 2013-10-13 NOTE — Telephone Encounter (Signed)
Patient called the office to report that she has a bacterial infection in her vaginal area. She is presently taking Augmentin one teaspoon and a half twice daily. Patient reports she started on this last Monday, 10-06-13.   Patient has been asked to continue Augmentin and take last dose on Wednesday, 10-15-13. She will complete bowel prep and take other antibiotics (Flagyl and neomycin) on Thursday as previously planned. She was encouraged to eat yogurt/probiotics daily. Patient states she already does this.   This patient is scheduled for [facility] on 10-17-13 at [facility]. We will keep this as scheduled per Dr. Evette CristalSankar. Patient aware and verbalizes understanding.

## 2013-10-17 ENCOUNTER — Inpatient Hospital Stay: Payer: Self-pay | Admitting: General Surgery

## 2013-10-17 DIAGNOSIS — K5732 Diverticulitis of large intestine without perforation or abscess without bleeding: Secondary | ICD-10-CM

## 2013-10-17 DIAGNOSIS — K660 Peritoneal adhesions (postprocedural) (postinfection): Secondary | ICD-10-CM

## 2013-10-17 HISTORY — PX: SIGMOID RESECTION / RECTOPEXY: SUR1294

## 2013-10-18 LAB — BASIC METABOLIC PANEL
Anion Gap: 6 — ABNORMAL LOW (ref 7–16)
BUN: 11 mg/dL (ref 7–18)
CHLORIDE: 103 mmol/L (ref 98–107)
Calcium, Total: 7.3 mg/dL — ABNORMAL LOW (ref 8.5–10.1)
Co2: 28 mmol/L (ref 21–32)
Creatinine: 1.15 mg/dL (ref 0.60–1.30)
EGFR (Non-African Amer.): 52 — ABNORMAL LOW
Glucose: 135 mg/dL — ABNORMAL HIGH (ref 65–99)
Osmolality: 275 (ref 275–301)
Potassium: 3.9 mmol/L (ref 3.5–5.1)
SODIUM: 137 mmol/L (ref 136–145)

## 2013-10-18 LAB — CBC WITH DIFFERENTIAL/PLATELET
BASOS ABS: 0 10*3/uL (ref 0.0–0.1)
BASOS PCT: 0.2 %
EOS ABS: 0 10*3/uL (ref 0.0–0.7)
EOS PCT: 0.1 %
HCT: 36.4 % (ref 35.0–47.0)
HGB: 11.8 g/dL — ABNORMAL LOW (ref 12.0–16.0)
Lymphocyte #: 1.7 10*3/uL (ref 1.0–3.6)
Lymphocyte %: 16.3 %
MCH: 28.6 pg (ref 26.0–34.0)
MCHC: 32.5 g/dL (ref 32.0–36.0)
MCV: 88 fL (ref 80–100)
Monocyte #: 0.9 x10 3/mm (ref 0.2–0.9)
Monocyte %: 8.7 %
NEUTROS ABS: 7.7 10*3/uL — AB (ref 1.4–6.5)
NEUTROS PCT: 74.7 %
Platelet: 224 10*3/uL (ref 150–440)
RBC: 4.14 10*6/uL (ref 3.80–5.20)
RDW: 14.3 % (ref 11.5–14.5)
WBC: 10.3 10*3/uL (ref 3.6–11.0)

## 2013-10-19 LAB — BASIC METABOLIC PANEL
Anion Gap: 7 (ref 7–16)
BUN: 6 mg/dL — ABNORMAL LOW (ref 7–18)
CHLORIDE: 105 mmol/L (ref 98–107)
CO2: 27 mmol/L (ref 21–32)
CREATININE: 1.09 mg/dL (ref 0.60–1.30)
Calcium, Total: 7.4 mg/dL — ABNORMAL LOW (ref 8.5–10.1)
EGFR (Non-African Amer.): 55 — ABNORMAL LOW
GLUCOSE: 113 mg/dL — AB (ref 65–99)
Osmolality: 276 (ref 275–301)
POTASSIUM: 3.4 mmol/L — AB (ref 3.5–5.1)
SODIUM: 139 mmol/L (ref 136–145)

## 2013-10-20 ENCOUNTER — Encounter: Payer: Self-pay | Admitting: General Surgery

## 2013-10-23 ENCOUNTER — Encounter: Payer: Self-pay | Admitting: General Surgery

## 2013-10-23 LAB — PATHOLOGY REPORT

## 2013-10-29 ENCOUNTER — Encounter: Payer: Self-pay | Admitting: General Surgery

## 2013-10-29 ENCOUNTER — Ambulatory Visit (INDEPENDENT_AMBULATORY_CARE_PROVIDER_SITE_OTHER): Payer: Self-pay | Admitting: General Surgery

## 2013-10-29 VITALS — BP 144/80 | HR 78 | Resp 14 | Wt 134.0 lb

## 2013-10-29 DIAGNOSIS — K5732 Diverticulitis of large intestine without perforation or abscess without bleeding: Secondary | ICD-10-CM

## 2013-10-29 NOTE — Patient Instructions (Signed)
Follow up appointment to be announced.  

## 2013-10-29 NOTE — Progress Notes (Signed)
Patient ID: Kylie HoseBarbara A Krenzer, female   DOB: 09/15/1955, 58 y.o.   MRN: [id number]  Chief Complaint  Patient presents with  . Routine Post Op    sigmoid resection    HPI Kylie HoseBarbara A [name] is a 58 y.o. female here today for her post op lap sigmoid resection done on 10/17/13. Patient states she still feels weak and is having lots of diarrhea after each meal.  No fever, no n/v. HPI  Past Medical History  Diagnosis Date  . Diverticulitis   . Hyperlipidemia   . GERD (gastroesophageal reflux disease)   . Hiatal hernia   . Asthma   . Murmur     Past Surgical History  Procedure Laterality Date  . Tubal ligation  1981  . Abdominal hysterectomy  2007  . Colonoscopy  2013    [facility]  . Upper gi endoscopy  2013    [facility]  . Appendectomy    . Sigmoid resection / rectopexy  10/17/13    Family History  Problem Relation Age of Onset  . Cancer Mother     colon    Social History History  Substance Use Topics  . Smoking status: Former Smoker -- 2.00 packs/day for 25 years    Types: Cigarettes    Quit date: 01/10/2011  . Smokeless tobacco: Not on file  . Alcohol Use: No    Allergies  Allergen Reactions  . Ciprofloxacin Hives  . Ivp Dye [Iodinated Diagnostic Agents] Other (See Comments)    shock  . Other Other (See Comments)    Myobetria/ dizzness  . Percocet [Oxycodone-Acetaminophen]   . Premarin [Conjugated Estrogens]   . Sulfa Antibiotics Hives  . Tequin [Gatifloxacin] Hives  . Latex Rash    Current Outpatient Prescriptions  Medication Sig Dispense Refill  . Albuterol Sulfate (PROAIR HFA IN) Inhale into the lungs 3 (three) times daily.      Marland Kitchen. ALPRAZolam (XANAX) 1 MG tablet Take 1 mg by mouth 3 (three) times daily.       . CRESTOR 10 MG tablet Take 10 mg by mouth daily.       Marland Kitchen. diltiazem (CARDIZEM) 120 MG tablet Take 120 mg by mouth 2 (two) times daily.       . metroNIDAZOLE (FLAGYL) 500 MG tablet Take 1 tablet (500 mg total) by mouth See  admin instructions. Take one 500mg  tablet at 6pm, then take one 500mg  tablet at 11 pm.  2 tablet  0  . montelukast (SINGULAIR) 10 MG tablet Take 10 mg by mouth at bedtime.       Marland Kitchen. neomycin (MYCIFRADIN) 500 MG tablet Take 1 tablet (500 mg total) by mouth See admin instructions. Take Two 500 mg tablets at 6 pm, then take Two 500 mg tablets at 11 pm.  4 tablet  0  . polyethylene glycol powder (GLYCOLAX/MIRALAX) powder Take 255 g by mouth once.  255 g  0   No current facility-administered medications for this visit.    Review of Systems Review of Systems  Constitutional: Positive for chills. Negative for fever, diaphoresis, activity change, appetite change and fatigue.  Gastrointestinal: Positive for diarrhea.    Blood pressure 144/80, pulse 78, resp. rate 14, weight 134 lb (60.782 kg).  Physical Exam Physical Exam  Constitutional: She appears well-developed and well-nourished.  Eyes: Conjunctivae are normal. No scleral icterus.  Cardiovascular: Normal rate, regular rhythm and normal heart sounds.   Pulmonary/Chest: Effort normal and breath sounds  normal.  Abdominal: Soft. Normal appearance and bowel sounds are normal. There is tenderness (mild, non focal).  Incision looks clean and healing well. Staples removed.    Data Reviewed Path- diverticulitis.   Assessment    Staplse removed. No sign of of infection. Diarrhea need to be checked.     Plan   C.Diff , CBC ordered.  F/U after  i get these labs        [facility] LLC[facility]AR,[name] 10/29/2013, 3:47 PM

## 2013-10-30 LAB — CBC WITH DIFFERENTIAL
Basophils Absolute: 0 10*3/uL (ref 0.0–0.2)
Basos: 0 %
Eos: 3 %
Eosinophils Absolute: 0.4 10*3/uL (ref 0.0–0.4)
HCT: 33.4 % — ABNORMAL LOW (ref 34.0–46.6)
Hemoglobin: 10.9 g/dL — ABNORMAL LOW (ref 11.1–15.9)
Immature Grans (Abs): 0 10*3/uL (ref 0.0–0.1)
Immature Granulocytes: 0 %
Lymphocytes Absolute: 2.1 10*3/uL (ref 0.7–3.1)
Lymphs: 19 %
MCH: 27.5 pg (ref 26.6–33.0)
MCHC: 32.6 g/dL (ref 31.5–35.7)
MCV: 84 fL (ref 79–97)
Monocytes Absolute: 0.8 10*3/uL (ref 0.1–0.9)
Monocytes: 7 %
Neutrophils Absolute: 7.8 10*3/uL — ABNORMAL HIGH (ref 1.4–7.0)
Neutrophils Relative %: 71 %
Platelets: 631 10*3/uL — ABNORMAL HIGH (ref 150–379)
RBC: 3.97 x10E6/uL (ref 3.77–5.28)
RDW: 14.3 % (ref 12.3–15.4)
WBC: 11.2 10*3/uL — ABNORMAL HIGH (ref 3.4–10.8)

## 2013-11-07 ENCOUNTER — Telehealth: Payer: Self-pay | Admitting: *Deleted

## 2013-11-07 NOTE — Telephone Encounter (Signed)
Patient want to know her Lab work results.

## 2013-11-10 ENCOUNTER — Encounter: Payer: Self-pay | Admitting: General Surgery

## 2013-11-15 LAB — C DIFFICILE, CYTOTOXIN B

## 2013-11-15 LAB — C DIFFICILE TOXINS A+B W/RFLX: C difficile Toxins A+B, EIA: NEGATIVE

## 2013-12-18 ENCOUNTER — Ambulatory Visit: Payer: BC Managed Care – PPO | Admitting: General Surgery

## 2014-01-27 ENCOUNTER — Encounter: Payer: Self-pay | Admitting: General Surgery

## 2014-01-27 ENCOUNTER — Ambulatory Visit (INDEPENDENT_AMBULATORY_CARE_PROVIDER_SITE_OTHER): Payer: Self-pay | Admitting: General Surgery

## 2014-01-27 VITALS — BP 114/78 | HR 86 | Resp 12 | Ht <= 58 in | Wt 138.0 lb

## 2014-01-27 DIAGNOSIS — K589 Irritable bowel syndrome without diarrhea: Secondary | ICD-10-CM

## 2014-01-27 DIAGNOSIS — K5732 Diverticulitis of large intestine without perforation or abscess without bleeding: Secondary | ICD-10-CM

## 2014-01-27 NOTE — Progress Notes (Signed)
Patient ID: Kylie [name], female   DOB: 04/09/1955, 59 y.o.   MRN: [id number]  Chief Complaint  Patient presents with  . Follow-up    HPI Kylie [name] is a 59 y.o. female.  Here today for follow up from colon resection 10-17-13. She states she is having constatnt abdominal pain near the incision site. States it feels like a"scraping " pain. She has had random vomiting episodes with nausea. She does admit to on/off diarrhea vs constipation as well. The diarrhea is described more as loose stools about 3-4 times a a day.  HPI  Past Medical History  Diagnosis Date  . Diverticulitis   . Hyperlipidemia   . GERD (gastroesophageal reflux disease)   . Hiatal hernia   . Asthma   . Murmur     Past Surgical History  Procedure Laterality Date  . Tubal ligation  1981  . Abdominal hysterectomy  2007  . Colonoscopy  2013    Dr Wandalee Ferdinand at [facility]  . Upper gi endoscopy  2013    Dr Wandalee Ferdinand at [facility]  . Appendectomy    . Sigmoid resection / rectopexy  10/17/13    Family History  Problem Relation Age of Onset  . Cancer Mother     colon    Social History History  Substance Use Topics  . Smoking status: Former Smoker -- 2.00 packs/day for 25 years    Types: Cigarettes    Quit date: 01/10/2011  . Smokeless tobacco: Not on file  . Alcohol Use: No    Allergies  Allergen Reactions  . Ciprofloxacin Hives  . Ivp Dye [Iodinated Diagnostic Agents] Other (See Comments)    shock  . Other Other (See Comments)    Myobetria/ dizzness  . Percocet [Oxycodone-Acetaminophen]   . Premarin [Conjugated Estrogens]   . Sulfa Antibiotics Hives  . Tequin [Gatifloxacin] Hives  . Latex Rash    Current Outpatient Prescriptions  Medication Sig Dispense Refill  . Albuterol Sulfate (PROAIR HFA IN) Inhale into the lungs 3 (three) times daily.    Marland Kitchen ALPRAZolam (XANAX) 1 MG tablet Take 1 mg by mouth 3 (three) times daily.     . montelukast (SINGULAIR) 10 MG tablet Take 10 mg by mouth at bedtime.       No current facility-administered medications for this visit.    Review of Systems Review of Systems  Constitutional: Negative.   Respiratory: Negative.   Cardiovascular: Negative.   Gastrointestinal: Positive for nausea, vomiting, abdominal pain, diarrhea and constipation.    Blood pressure 114/78, pulse 86, resp. rate 12, height  (1.448 m), weight 138 lb (62.596 kg).  Physical Exam Physical Exam  Constitutional: She is oriented to person, place, and time. She appears well-developed and well-nourished.  Eyes: Conjunctivae are normal. No scleral icterus.  Neck: Neck supple.  Cardiovascular: Normal rate, regular rhythm and normal heart sounds.   Pulmonary/Chest: Effort normal and breath sounds normal.  Abdominal: Soft. Normal appearance and bowel sounds are normal. There is tenderness. No hernia.  Mild tenderness to left of incision, no guarding and no hernia.  Lymphadenopathy:    She has no cervical adenopathy.  Neurological: She is alert and oriented to person, place, and time.  Skin: Skin is warm and dry.    Data Reviewed Labs and office notes.  Assessment    Stable physical exam.Pt with ongoing symptoms suggesting IBS, possible adhesions( these were noted at time of surgery)    Plan    Recommend  Miralax daily to help regulate bowel movements. If this works it may alleviate a lot of her abd symptoms.  Follow up in 2 months.         [name],[name] 01/27/2014, 3:22 PM

## 2014-01-27 NOTE — Patient Instructions (Addendum)
The patient is aware to call back for any questions or concerns. Recommend Miralax daily to help regulate bowel movements.

## 2014-03-10 DIAGNOSIS — Q2730 Arteriovenous malformation, site unspecified: Secondary | ICD-10-CM

## 2014-03-10 HISTORY — DX: Arteriovenous malformation, site unspecified: Q27.30

## 2014-03-16 DIAGNOSIS — K5732 Diverticulitis of large intestine without perforation or abscess without bleeding: Secondary | ICD-10-CM

## 2014-03-18 ENCOUNTER — Ambulatory Visit: Payer: Self-pay | Admitting: General Surgery

## 2014-04-02 ENCOUNTER — Ambulatory Visit: Payer: Self-pay | Admitting: Family Medicine

## 2014-04-07 ENCOUNTER — Ambulatory Visit: Payer: BLUE CROSS/BLUE SHIELD | Admitting: General Surgery

## 2014-04-13 ENCOUNTER — Ambulatory Visit
Admit: 2014-04-13 | Disposition: A | Discharge status: Home / Self Care | Payer: Self-pay | Attending: Vascular Surgery | Admitting: Vascular Surgery

## 2014-04-13 LAB — BUN: BUN: 28 mg/dL — AB

## 2014-04-13 LAB — CREATININE, SERUM
CREATININE: 0.96 mg/dL
EGFR (African American): >60

## 2014-04-28 NOTE — H&P (Signed)
PATIENT NAME:  Kylie [name], Shiquita A MR#:  [id number] DATE OF BIRTH:  Jun 17, 1955  PRIMARY CARE PHYSICIAN: [facility] [place].   CODE STATUS: Full code.   CHIEF COMPLAINT: Shortness of breath and cough.   HISTORY OF PRESENT ILLNESS:  The patient is a 59 year old female with history of IBS, COPD who presents with cough and shortness of breath for about 1 week's duration. The patient developed a cough about 8 days ago associated with shortness of breath and pleuritic chest pain and fever.  She notes a fever of 103. She presented to the Emergency Department 12/18 which is 3 days ago and was treated for concern of right lower lobe pneumonia with Rocephin, nebulizers, Solu-Medrol. She was then subsequently discharged with amoxicillin. After discharge she continued to have symptoms including diffuse body aches, cough, shortness of breath so she returned to the Emergency Department. The x-ray from today's evaluation does not show pneumonia. However, initial evaluation noted the patient to be hypoxic with O2 sats in the 80s, 81 at one point.  Of note, when she presented in the ED on 12/18 her O2 sat ranged between 90% to 91%.  Also on physical examination she had decreased air movement. She has now received 2 DuoNebs treatments, Solu-Medrol, azithromycin and supplemental oxygen with improvement of her pulmonary exam.  However, we are being called for admission for COPD exacerbation.  In addition, over this same period of time the patient notes that she has been having nausea, vomiting and diarrhea. Since discharge she has been unable to keep any of her medications down including the prednisone and antibiotics that she was discharged with.  She characterizes the diarrhea as watery, nonbloody, nonmucousy stools that are foul-swelling in nature. She notes that she has had about 10 bowel movements today day.  Of note, the patient quit smoking 1 week ago,  Prior to that she has at least a 20 pack-year history.  The  history is obtained from ED staff, the patient, as well as review of past medical records.   PAST MEDICAL HISTORY:  1.  COPD. 2.  Hypertension.  3.  Diverticulosis.  4.  Colitis.  5.  IBS. 6.  History of a heart murmur.   history.   PAST SURGICAL HISTORY: 1.  Tubal ligation.  2.  Total abdominal hysterectomy.  3.  Exploratory laparoscopy.   MEDICATIONS: 1.  Albuterol inhaler 1 puff inhaled 4 times a day.  2.  Alprazolam 1 mg twice a day.  3.  Amoxicillin tablet daily.  4.  Diltiazem 120 mg twice a day.  5.  Promethazine 25 mg rectal suppository every 6 hours.  6.  Singulair 10 mg daily. 7.  Zithromax Z-Pak.   FAMILY HISTORY: Mother deceased from colon cancer at age 57. Father deceased of accidental death from a fall. He did have valvular heart disease, IBS and diverticulosis.   SOCIAL HISTORY: The patient smoked at least 1 pack per day for approximately 20 years. She quit 1 week ago. She denies alcohol use. She is unemployed currently.   REVIEW OF SYSTEMS: CONSTITUTIONAL: Admits to fever, malaise and generalized fatigue and weakness.  EYES: No double vision, eye pain.  ENT: No tinnitus, ear pain, hearing loss.  RESPIRATORY: Admits to cough, shortness of breath and pleuritic chest pain.  CARDIOVASCULAR: Denies orthopnea, edema or palpitations.  GASTROINTESTINAL: Admits to nausea, vomiting and diarrhea. Denies melena or rectal bleeding.  GENITOURINARY: Denies frequency or dysuria.  ENDOCRINE: Admits to increased sweats. INTEGUMENT:  Did have skin  rash on initial presentation that is resolved.  MUSCULOSKELETAL: Admits to myalgias. Denies arthralgias or joint swelling.  NEUROLOGICAL: Denies numbness, tingling, dysarthria or headaches.  PSYCH: History of anxiety. Denies depression.   PHYSICAL EXAMINATION:  VITAL SIGNS: Temperature 97.9, pulse 94, blood pressure 145/70, respirations 20, sating 92% on nasal cannula.  GENERAL: Ill appearing Caucasian female in no respiratory  distress.  PSYCHIATRIC: Awake, alert, oriented x 3.  Judgment and insight are intact.  EYES: Pupils are equally round and reactive to light and accommodation. Anicteric sclerae, normal lids.  ENT: Normal external ears and nares. Posterior oropharynx is clear without erythema, exudate or thrush.  NECK: Supple. There is no thyromegaly.  LYMPHATICS: There is a 0.5 mm posterior auricular cervical lymph node that is well circumscribed and freely mobile. No other lymphadenopathy is identified in the cervical, inguinal or axillary regions.  CARDIOVASCULAR: S1, S2, regular rate and rhythm. There is a no pretibial edema.  PULMONARY: Normal effort currently. Clear to auscultation bilaterally at this point. Initial exam showed that she has decreased air movement diffusely. ABDOMEN: There is diffuse abdominal tenderness on mild palpation. No organomegaly appreciated.  SKIN: Warm and dry. No rash.  EXTREMITIES: Full range of motion in bilateral upper and lower extremities with no clubbing or cyanosis appreciated.   LABORATORY DATA: Glucose 88, BUN 13, creatinine 1.07, sodium 139, potassium 3.9, chloride 103, bicarbonate 29, calcium 8.6, bilirubin 0.3, alkaline phosphatase 104, ALT 17, AST 12, total protein 8, albumin 3.4, osmolality 277, anion gap 7.  White count 8.6, hemoglobin 15.1, hematocrit 44.4, platelets 250 with an MCV of 87.  Lipase is 213.  Blood cultures from December 18 when she last presented showed no growth at 48 hours for 2 sets. Urinalysis on the 18th was negative.  White count on the 18th was 5.5 as well.    CHEST X-RAY: No acute disease of the chest.   ASSESSMENT AND PLAN: This is a 59 year old female presenting with cough, shortness of breath, hypoxia consistent with underlying chronic obstructive pulmonary disease history consistent with chronic obstructive pulmonary disease exacerbation with acute hypoxic respiratory failure. The patient also has diarrhea.  1.  Chronic obstructive pulmonary  disease exacerbation.  We will continue DuoNebs, Solu-Medrol, Zithromax for anti-inflammatory properties. Continue supplemental oxygen. Anticipate the patient will improve slowly.  2.  Acute hypoxic respiratory failure, most likely due to chronic obstructive pulmonary disease exacerbation.  Continue supplemental oxygen.   3.  Diarrhea. The patient does have an underlying history of irritable bowel syndrome and when I go back to review, she has had colonoscopies for chronic diarrhea so I am unsure if this is an acute exacerbation of her irritable bowel syndrome versus new infectious process, although I doubt infection given that she does not have leukocytosis at this point. We will rule her out for community acquired Clostridium difficile by checking Clostridium difficile PCR. If negative, then we can start symptomatic management.  4.  Hypertension, well controlled.  5.  CODE STATUS: Full code.   DISPOSITION: The patient is being admitted inpatient for evaluation and management of acute hypoxic respiratory failure due to acute chronic obstructive pulmonary disease exacerbation as well as diarrhea. I anticipate she will require 2 hospital midnights for evaluation and management.   Time Spent coordinating admission: 45 minutes.   ____________________________ Aurther Loft, DO aeo:ct D: 12/30/2011 06:22:13 ET T: 12/30/2011 12:16:44 ET JOB#: [id number]  cc: Aurther Loft, DO, <Dictator> Javontay Vandam E Legacie Dillingham DO ELECTRONICALLY SIGNED 01/08/2012 12:34

## 2014-04-28 NOTE — Discharge Summary (Signed)
PATIENT NAME:  Kylie [name], Kylie [name] MR#:  [id number] DATE OF BIRTH:  02-01-1955  DATE OF ADMISSION:  12/30/2011 DATE OF DISCHARGE:  12/31/2011  PRIMARY CARE PHYSICIAN: Dr. Dossie Arbourrissman.  DISCHARGE DIAGNOSES:  1. Acute hypoxia respiratory failure.  2. Chronic obstructive pulmonary disease exacerbation.  3. Diarrhea.  4. Hypertension.  5. Tobacco abuse.   CONDITION: Stable.   CODE STATUS: Full code.  MEDICATIONS:  1. Singulair 10 mg once daily.  2. Alprazolam 1 mg p.o. b.i.d. 3. Diltiazem 120 mg p.o. b.i.d. 4. Albuterol CF-free 90 mcg inhalation one puff 4 times [name] day p.r.n. for shortness of breath. 5. Zithromax 500 mg p.o. tablets 1 tablet once daily for 3 days.  6. Prednisone 40 mg p.o. 2 days, 20 mg p.o. 2 days, then 10 mg p.o. 2 days.   DIET: Low sodium diet.   ACTIVITY: As tolerated.   FOLLOW-UP CARE: Follow up with PCP within 1 to 2 weeks. In addition, the patient was counseled for smoking cessation.   REASON FOR ADMISSION: Shortness of breath and cough.   HOSPITAL COURSE:  1. The patient is [name] 59 year old female with [name] history of COPD, IBS, presented to the ED with shortness of breath, cough for one week duration. In the ED, the patient was noted to have hypoxia with O2 saturation 80s. She was treated with DuoNebs, Solu-Medrol, Zithromax and oxygen, and admitted for COPD exacerbation. For detailed history and physical examination, please refer to the admission note dictated by Dr. Marc Morganskafor. On admission date, the patient's chest x-ray showed no acute disease of chest. The patient's white count was 5.5. Urinalysis was negative. After admission, the patient had been treated with DuoNebs, Solu-Medrol, Zithromax, and oxygen. The patient's symptoms have much improved after above-mentioned treatment. 2. Diarrhea. The patient had multiple loose stool on the day of admission, but improved on the second day of admission. The patient's C. diff was negative. Diarrhea is possibly due to IBS or viral  syndrome.  3. Hypertension was controlled.  4. The patient was clinically stable and was discharged to home yesterday. Discussed the patient's discharge plan with the patient and the case manager.   TIME SPENT: About 32 minutes.     ____________________________ Shaune PollackQing Fannye Myer, MD qc:es D: 01/01/2012 15:46:23 ET T: 01/02/2012 08:55:53 ET JOB#: [id number]  cc: Shaune PollackQing Mikaeel Petrow, MD, <Dictator> Shaune PollackQING Ajai Terhaar MD ELECTRONICALLY SIGNED 01/03/2012 16:08

## 2014-05-02 NOTE — H&P (Signed)
PATIENT NAME:  Kylie [name], Kylie [name] MR#:  [id number] DATE OF BIRTH:  1955-08-20  DATE OF ADMISSION:  08/17/2013  PRIMARY CARE PHYSICIAN: Dr. Vonita Moss   REFERRING PHYSICIAN: Dr. Sharyn Creamer  CHIEF COMPLAINT: Abdominal pain, nausea, vomiting.   HISTORY OF PRESENT ILLNESS: This patient is a 59 year old female with history of irritable bowel syndrome, hypertension, anxiety, depression, who comes to the Emergency Department with complaints of abdominal pain of four days' duration. The patient initially presented to the Emergency Department on the 08/14/2013. The patient underwent a CT abdomen and pelvis which showed mildly diverticulitis. The patient had mild elevation of the WBC count of 10.8. The patient was placed on Augmentin and was discharged home. The patient states continued to have nausea, unable to keep down food. Also has been having multiple episodes of diarrhea. Concerning this, came to the Emergency Department. Work-up in the Emergency Department, the patient had slightly increased WBC count of 12,000 but no left shift. The patient states last colonoscopy was two years back. The patient is given one dose of Zosyn in the Emergency Department. The patient is allergic to quinolones.   PAST MEDICAL HISTORY: 1.  Chronic obstructive pulmonary disease.  2.  Hypertension.  3.  Diverticulosis.  4.  Irritable bowel syndrome.    PAST SURGICAL HISTORY:  1.  Tubal ligation.  2.  Total abdominal hysterectomy.  3.  Exploratory laparotomy.   ALLERGIES:  1.  IV DYE. 2.  NUTS. 3.  SULFA. 4.  PERCOCET.  5.  TEQUIN.  6.  CIPRO.  7.  LATEX.    HOME MEDICATIONS: 1.  Zofran 4 mg 3 times a day.  2.  Xanax 1 mg 2 times a day.  3.  Singulair 10 mg once a day.  4.  Diltiazem 120 mg 2 times a day.  5.  Augmentin 2 times a day.  6.  Albuterol 2 puffs 4 times a day.  7.  Percocet 5/325 mg every six hours as needed.    SOCIAL HISTORY: Quit smoking in 2013. Denies drinking alcohol or using illicit  drugs. Married, lives with her husband.   FAMILY HISTORY: Mother died of colon cancer at the age of 19. Father died of incidental death from a fall. Also had a history of valvular heart disease, irritable bowel syndrome and diverticulosis.   REVIEW OF SYSTEMS: CONSTITUTIONAL: Experiences generalized weakness.  EYES: No change in vision.  ENT: No change in hearing.   RESPIRATORY: No cough, shortness of breath.  CARDIOVASCULAR: No chest pain, palpitations. GENITOURINARY: No dysuria or hematuria.  HEMATOLOGIC: No easy bruising or bleeding.  GASTROINTESTINAL: Has nausea, vomiting, diarrhea.  SKIN: No rash or lesions.  MUSCULOSKELETAL: No joint pains and aches.  NEUROLOGIC: No weakness or numbness in any part of the body.   PHYSICAL EXAMINATION: GENERAL: This is a well-built, well-nourished, age-appropriate female lying down in the bed, not in distress.  VITAL SIGNS: Temperature 98.6, pulse 95, blood pressure 115/79, respiratory rate of 16, oxygen saturation is 95% on room air.  HEENT: Head normocephalic, atraumatic. There is no scleral icterus. Conjunctivae normal. Pupils equal and reactive. Extraocular movements are intact. Mucous membranes moist. No pharyngeal erythema.  NECK: Supple. No lymphadenopathy. No JVD. No carotid bruit. No thyromegaly.  CHEST: Has no focal tenderness.  LUNGS: Bilaterally clear to auscultation.  HEART: S1 and S2 regular. No murmurs are heard.  ABDOMEN: Bowel sounds present. Soft. Mild tenderness in the left lower quadrant. No rebound or guarding. Could not appreciate any hepatosplenomegaly.  EXTREMITIES: No pedal edema. Pulses 2+.  SKIN: No rash or lesions.  MUSCULOSKELETAL: Good range of motion in all the extremities.  NEUROLOGIC: The patient is alert, oriented to place, person, and time. Cranial nerves II through XII intact. Motor 5/5 in upper and lower extremities.   LABORATORY DATA: Urinalysis negative for nitrites and leukocyte esterase.   CBC: WBC of  12,000, hemoglobin of 14, platelet count of 321.   KUB: No evidence of any bowel obstruction.   ASSESSMENT AND PLAN: The patient is a 59 year old female who was recently diagnosed with diverticulitis and continued to have symptoms, with fever, nausea, vomiting and diarrhea. Concerning about failed outpatient treatment, with a diagnosis of diverticulitis.  1.  Diverticulitis. Keep the patient on nothing by mouth. Considering the patient's nausea vomiting. Keep the patient on Zosyn, which should cover gram-negatives and anaerobes. Follow up.  2.  Diarrhea. Considering the patient's recent use of antibiotics, we will check the Clostridium difficile toxin. Unlikely, as the patient does not have any inflammation of the overall colon. However, will make sure that the patient does not have any Clostridium difficile colitis.  3.  Hypertension, not on any medications. The patient is on diltiazem 120 mg, but the patient could not tell.  4.  Keep the patient on deep vein thrombosis prophylaxis with Lovenox.   TIME SPENT: 50 minutes.    ____________________________ Susa GriffinsPadmaja Cartina Brousseau, MD pv:cg D: 08/17/2013 23:26:13 ET T: 08/18/2013 00:05:18 ET JOB#: [id number]  cc: Susa GriffinsPadmaja Ninette Cotta, MD, <Dictator> Steele SizerMark A. Crissman, MD Susa GriffinsPADMAJA Rob Mciver MD ELECTRONICALLY SIGNED 08/27/2013 21:26

## 2014-05-02 NOTE — Discharge Summary (Signed)
PATIENT NAME:  [name], Jari A MR#:  [id number] DATE OF BIRTH:  1955-06-07  DATE OF ADMISSION:  08/17/2013 DATE OF DISCHARGE:  08/19/2013  PRESENTING COMPLAINT: Abdominal pain, along with diarrhea and vomiting.   DISCHARGE DIAGNOSES: 1.  Acute diverticulitis, improving.  2.  History of diverticulosis.   MEDICATIONS AT DISCHARGE:   1.  Diltiazem 120 CD 1 tablet b.i.d.  2.  Albuterol 2 puffs 4 times a day as needed.  3.  Xanax 1 mg p.o. b.i.d.  4.  Singulair 10 mg in the morning.  5.  Augmentin liquid 9 mL b.i.d. for 7 more days.  6.  Acetaminophen/hydrocodone 5/325 one tablet every 6 hours as needed.   DIET: Mechanical soft diet.  FOLLOWUP:  With Dr. Dossie Arbourrissman in 1-2 weeks.   LABORATORY DATA: White count at discharge is 12.0. UA negative for UTI.    BRIEF SUMMARY OF HOSPITAL COURSE:   1.  Ms. Kylie [name] is a 59 year old Caucasian female who was recently diagnosed with diverticulitis with failed outpatient treatment, came in with nausea, vomiting, and fever. She was admitted with acute sigmoid diverticulitis, kept on clear liquid diet, and IV Zosyn was given. She was switched to p.o. liquid Augmentin, which the patient already has, to finish up a course of 10 days.  Patient was afebrile and was improving clinically.  2.  Hypertension. Resumed diltiazem. 3.  DVT prophylaxis was with Lovenox.  Hospital stay otherwise remained stable. The patient remained a full code.     TIME SPENT: Forty minutes.   ____________________________ Wylie HailSona A. Allena KatzPatel, MD sap:LT D: 08/19/2013 14:52:09 ET T: 08/19/2013 17:09:00 ET JOB#: [id number]  cc: Ezell Poke A. Allena KatzPatel, MD, <Dictator> Willow OraSONA A Adalyne Lovick MD ELECTRONICALLY SIGNED 08/20/2013 13:53

## 2014-05-02 NOTE — Discharge Summary (Signed)
PATIENT NAME:  Kylie [name], [name] MR#:  [id number] DATE OF BIRTH:  [birth year]  DATE OF ADMISSION:  10/17/2013 DATE OF DISCHARGE:  10/22/2013  HISTORY OF PRESENT ILLNESS: This is a 59 year old female who was documented to have recurring episodes of diverticulitis involving the sigmoid colon. The patient had multiple courses of antibiotics and also admitted to the hospital 2 to 3 times within the last year for the same problem. After a full discussion it was decided that she would benefit with a focal resection of this area of involvement. Accordingly, the patient was prepared for surgery. On 10/17/2013, she was taken to the Operating Room and underwent a laparoscopy-assisted sigmoid colon resection. In the course of surgery, it was noted that she had a significant amount of omental adhesions which required lysis before the procedure could be completed and this occupied a good portion of the time.   PAST MEDICAL HISTORY: The patient has a history of an irregular heartbeat, which has been evaluated and stable. She was a former smoker. Hypertension, hypercholesterolemia, hypothyroidism.   PAST SURGICAL HISTORY: Included appendectomy, tubal ligation, D and C, hysterectomy, and other minor laparoscopic pelvic procedures.   COURSE IN THE HOSPITAL: Following surgery, the patient was admitted and remained relatively stable for the next 48 hours, at which point her Foley was discontinued. She was then noted to have a return of bowel activity and she was started on a liquid diet and gradually advanced to soft food. On the day prior to discharge, there was a scant amount of redness surrounding the midportion of the incision and a couple of staples here were removed. However, there did not appear to be any signs of fluid collection or an abscess. The patient remained stable thereafter and she was discharged home in stable condition on 10/22/2013 to be followed as an outpatient.   FINAL DIAGNOSES:  1.  Sigmoid  diverticulitis.  2.  Abdominal omental adhesions.   PROCEDURES PERFORMED: Laparoscopy, lysis of adhesions, and sigmoid colectomy    ____________________________ S.Wynona LunaG. [name], MD sgs:at D: 11/07/2013 07:31:26 ET T: 11/07/2013 09:02:38 ET JOB#: [id number]  cc: Timoteo ExposeS.G. Evette CristalSankar, MD, <Dictator> [facility] Wynona LunaG [name] MD ELECTRONICALLY SIGNED 11/09/2013 20:21

## 2014-05-02 NOTE — Op Note (Signed)
PATIENT NAME:  Kylie [name], Kylie [name] MR#:  [id number] DATE OF BIRTH:  [birth year]  DATE OF PROCEDURE:  10/17/2013  PREOPERATIVE DIAGNOSIS: Recurrent diverticulitis of sigmoid colon.  POSTOPERATIVE DIAGNOSIS: Recurrent diverticulitis of sigmoid colon, significant adhesions to the abdomen.  PROCEDURES PERFORMED: Laparoscopy, lysis of adhesions, sigmoid colectomy.   SURGEON: Kathreen CosierS. G. Yarelly Kuba, M.D.   ANESTHESIA: General.   COMPLICATIONS: None.  BLOOD LOSS: 200 mL.   DRAINS: None.   DESCRIPTION OF PROCEDURE: The patient was put to sleep in the supine position on the operating room table. [name] Foley catheter was inserted. The abdomen was prepped and draped out as [name] sterile field. Timeout was performed. [name] small 1 cm incision was made below the umbilicus at the site of [name] previous incision and deepened through and the fascia was lifted up. [name] Veress needle was positioned in the peritoneal cavity and verified with the hanging drop method. Pneumoperitoneum was obtained. [name] 10 mm port was placed and the camera was introduced. It was noted immediately that the patient had diffuse abdominal adhesions of the omentum covering the entire abdominal wall down to the pelvis. There were some open areas that were noted in the right side and one on the left and 2 additional ports were placed, 1 on the right lung, 1 on the left, and the adhesions of the omentum were then carefully taken down until the omentum was freed from the anterior abdominal wall, except for the pelvis. At this point, the laparoscopy portion was abandoned. [name] vertical laparotomy incision was made extending the port site below the umbilicus, above the umbilicus and below, and [name] hand port was placed. Again, it was noted that the adhesions were significant in the pelvic area and therefore further attempts at laparoscopy were not made. With adequate exposure, the omental adhesions to the pelvic area were carefully taken down. [name] bulk of the time involved in the  dissection was primarily the omentum and small amounts of the small bowel that were adherent in the right lower quadrant. Total time for this was 45 minutes to an hour. After this was done, the bowel was displaced superiorly to expose the sigmoid colon, which was then mobilized from the lateral aspect. Multiple diverticula were noted throughout the sigmoid colon. With the distal one-third being spared, the middle one-third and the proximal one-third had significant amount of diverticulosis with one 3 inch long segment that was extremely thick. This was likely the source for her recurrent pain. Mobilization was completed and the left ureter was identified and preserved. The left colon was then mobilized all the way up to the splenic flexure to allow for easy reapproximation of the bowel. At this point, it was decided to resect this portion of the sigmoid colon containing the diverticula. The distal was transected after application of [name] Glassman clamp and the mesentery was then down with the use of Harmonic device, up to the proximal portion, and bowel was transected, also after placing [name] Glassman clamp. The sigmoid colon was sent off subsequently to pathology in formalin. The 2 ends were brought up and it was noted that the sigmoid colon was extremely small measuring about 1.5 cm in size. This was not suitable for stapled anastomosis given the small size. An end-to-end anastomosis with sutures was then performed. [name] layer of 3-0 silk was placed in the posterior wall followed by mucosal closure with [name] running 2-0 Vicryl, and after this was completed an anterior layer of 3-0 silk was placed in the seromuscular area.  Pressure dressing was then performed by placing [name] sigmoidoscope in the rectum and inflating the air. There was [name] leak identified in the posterior left aspect which was reinforced with silk until this leak was completely stopped. The abdomen was then irrigated and then closed. The posterior sheath and  peritoneum were closed with [name] layer of 2-0 Vicryl. The wound was irrigated. The fascia was then closed with interrupted figure-of-eight stitches of 0 Prolene. The wound was again irrigated. The subcutaneous tissue was closed with 2-0 Vicryl and the skin approximated with staples. The 2 small port sites were also closed with staples. [name] dry sterile dressing was placed. The patient subsequently was extubated and returned to the recovery room in stable condition.    ____________________________ S.Wynona Luna, MD sgs:sb D: 10/20/2013 07:46:00 ET T: 10/20/2013 09:09:26 ET JOB#: [id number]  cc: S.G. Evette Cristal, MD, <Dictator> [facility] Wynona Luna MD ELECTRONICALLY SIGNED 10/20/2013 15:39

## 2014-05-10 NOTE — Op Note (Signed)
PATIENT NAME:  Kylie [name], Kylie [name] MR#:  [id number] DATE OF BIRTH:  [birth year]  DATE OF PROCEDURE:  04/13/2014  PREOPERATIVE DIAGNOSES: 1.  Arteriovenous malformation, left leg.  2.  Irregular heartbeat.  3.  Hypertension.  4.  Anxiety and depression.  5.  Hyperlipidemia.   POSTOPERATIVE DIAGNOSES: 1.  Arteriovenous malformation, left leg.  2.  Irregular heartbeat.  3.  Hypertension.  4.  Anxiety and depression.  5.  Hyperlipidemia.   PROCEDURES: 1.  Ultrasound guidance for vascular access to right femoral artery.  2.  Catheter placement to left profunda femoris artery and left superficial femoral artery from right femoral approach.  3.  Aortogram and selective left lower extremity angiogram including selective injections in the profunda femoris and the superficial femoral arteries.  4.  Covered stent placement with a 6 mm diameter x 15 cm length Viabahn covered stent to the left superficial femoral artery to exclude any flow into the arteriovenous malformation.  5.  StarClose closure device right femoral artery.   SURGEON: Kylie [name], M.D.   ANESTHESIA: Local with moderate conscious sedation.   ESTIMATED BLOOD LOSS: Minimal.   CONTRAST USED: 70 mL of Visipaque.   INDICATION FOR PROCEDURE: This is a 59 year old female who saw me in the office last week. She has an enlarging AV malformation in her left thigh. She had a MRI which confirmed this and on my review it looked like this was in the mid thigh. It was unclear if this was a profunda femoris branch or a superficial femoral artery branch. There were multiple branches feeding this on the MRI. She is brought in for angiography and possible treatment of this lesion. It was discussed that she would likely still require injections to the residual venous component even if arterial inflow was shut down. Risks and benefits were discussed. Informed consent was obtained.   DESCRIPTION OF PROCEDURE: The patient was brought to the vascular  suite. Groins were shaved and prepped and a sterile surgical field was created. The right femoral head was localized with fluoroscopy. It was then visualized with ultrasound and found to be widely patent. It was then accessed under direct ultrasound guidance without difficulty with a Seldinger needle. A J-wire and 5-French sheath were placed and a permanent image was recorded. A pigtail catheter was placed in the aorta at the L1-L2 level and AP aortogram was performed. This showed some tortuosity of the left renal artery. It was unclear if there was any significant stenosis or FMD. The right renal artery appeared normal. The aorta and iliac segments were small. There was a steep aortic bifurcation, but there was no significant stenosis or disease. I then crossed the aortic bifurcation, advanced to the left femoral head and selective left lower extremity angiogram was performed. On initial imaging, it was not entirely clear where the feeding branches off of the superficial femoral or the profunda femoris artery, but there did appear to be several prominent branches medially off the superficial femoral artery, in its mid segment, that were short and drained directly into the area corresponding with the AV malformation. To further elucidate it, I gave the patient 2500 units of intravenous heparin dose and began imaging. I advanced a Kumpe catheter out into the profunda femoris artery beyond its primary branch into the main profunda femoris and performed imaging. I did not see a connection into any AV malformation or any abnormal flow with selected in the left profunda femoris artery. This would confirm that treatment of the SFA  would be more likely to help as there were several short small branches that appeared to be feeding into the AV malformation. I felt the most efficient way to exclude the arterial inflow would be to place a covered stent over each of these branches. These were short and going to be very  difficult to coil embolize without putting the native SFA at risk. With this I passed a catheter down into the SFA, performed selective imaging which again confirmed what we saw on the original aortogram. I wanted to treat these several SFA branches so I exchanged for an 0.018 wire and parked this into the tibial vessels. I took a 6 mm diameter x 15 cm in length Viabahn stent and in the mid to distal SFA covered the SFA with the covered stent. This was post dilated with a 5 mm balloon with an excellent angiographic completion result, other than some arterial spasm from catheter placement. The main vessels were all widely patent and we had successfully excluded and removed the several medial branches that appeared to be feeding the AV malformation. At this point, I elected to terminate the procedure. The sheath was removed and StarClose closure device was deployed in the usual fashion with excellent hemostatic result. The patient tolerated the procedure well and was taken to the recovery room in stable condition.  ____________________________ Kylie NeedyJason S. Dmari Schubring, MD jsd:sb D: 04/13/2014 08:55:21 ET T: 04/13/2014 09:58:04 ET JOB#: [id number]  cc: Kylie NeedyJason S. Kylie Dreese, MD, <Dictator> Kylie NeedyJASON S Dail Lerew MD ELECTRONICALLY SIGNED 04/13/2014 15:53

## 2014-09-08 DIAGNOSIS — D649 Anemia, unspecified: Secondary | ICD-10-CM | POA: Insufficient documentation

## 2014-09-08 DIAGNOSIS — E7800 Pure hypercholesterolemia, unspecified: Secondary | ICD-10-CM | POA: Insufficient documentation

## 2014-09-08 DIAGNOSIS — N182 Chronic kidney disease, stage 2 (mild): Secondary | ICD-10-CM | POA: Insufficient documentation

## 2014-09-11 ENCOUNTER — Ambulatory Visit (INDEPENDENT_AMBULATORY_CARE_PROVIDER_SITE_OTHER): Payer: BLUE CROSS/BLUE SHIELD | Admitting: Family Medicine

## 2014-09-11 ENCOUNTER — Encounter: Payer: Self-pay | Admitting: Family Medicine

## 2014-09-11 VITALS — BP 137/85 | HR 100 | Temp 97.4°F | Ht <= 58 in | Wt 148.0 lb

## 2014-09-11 DIAGNOSIS — Q2730 Arteriovenous malformation, site unspecified: Secondary | ICD-10-CM

## 2014-09-11 DIAGNOSIS — L601 Onycholysis: Secondary | ICD-10-CM | POA: Diagnosis not present

## 2014-09-11 DIAGNOSIS — I10 Essential (primary) hypertension: Secondary | ICD-10-CM

## 2014-09-11 DIAGNOSIS — M79642 Pain in left hand: Secondary | ICD-10-CM

## 2014-09-11 DIAGNOSIS — N952 Postmenopausal atrophic vaginitis: Secondary | ICD-10-CM | POA: Diagnosis not present

## 2014-09-11 DIAGNOSIS — N183 Chronic kidney disease, stage 3 unspecified: Secondary | ICD-10-CM

## 2014-09-11 DIAGNOSIS — M79641 Pain in right hand: Secondary | ICD-10-CM | POA: Diagnosis not present

## 2014-09-11 NOTE — Patient Instructions (Addendum)
Try turmeric as a natural anti-inflammatory (for pain and arthritis). It comes in capsules where you buy aspirin and fish oil, but also as a spice where you buy pepper and garlic powder. Use ice 15 minutes at a time 3 times a day; always have thin cloth between ice and skin If not improving after 3-4 weeks, call me and we can consider xrays Do let Dr. Wyn Quaker know about your leg and plan the next treatment Consider bio-identical hormones, which you can find a prescriber on the internet I will let you do the investigation to see if this is a legitimate prescriber: Call [phone] Ext. [phone] to schedule your Bioidentical Hormones consultation or to learn more about your local [facility] Bioidentical Hormones Specialist Dorothyann Peng, M.D. I have no relationship with this person and do not refer you, just giving you info Please do get a flu shot when those come available

## 2014-09-11 NOTE — Progress Notes (Signed)
BP 137/85 mmHg  Pulse 100  Temp(Src) 97.4 F (36.3 C)  Ht 4\' 9"  (1.448 m)  Wt 148 lb (67.132 kg)  BMI 32.02 kg/m2  SpO2 95%   Subjective:    Patient ID: Kylie [name], female    DOB: 09/25/1955, 59 y.o.   MRN: [id number]  HPI: Kylie [name] is a 59 y.o. female  Chief Complaint  Patient presents with  . Hand Pain    Bilateral. Have been bothering her much more lately.  . Leg Pain    Wonders if it is due to her stent placement by Dr. Wyn Quaker.   She had the shots in her leg; her leg swelled up like a cherry pie; the doctor said it would bruise, and said it would hurt; she cried; happened two months ago; she ws supposed to go have it done again, but has not gone back; can hardly walk the dog, can hardly walk through the house; she does not need a cane or walker; she did not let Dr. Wyn Quaker know yet  She has some knots across the back of both hands; same spot in both hands; hands have been bothering her; hurts, very painful; making a fist makes it worse; worse at different times; no other swelling  She had a red ant bite on her foot a while back; toenails look funny, look almost white  She has been dry and cracking down below; she can't take Premarin, allergic to it; has seen GYN  Relevant past medical, surgical, family and social history reviewed and updated as indicated. Interim medical history since our last visit reviewed. Allergies and medications reviewed and updated.  Review of Systems  Per HPI unless specifically indicated above     Objective:    BP 137/85 mmHg  Pulse 100  Temp(Src) 97.4 F (36.3 C)  Ht 4\' 9"  (1.448 m)  Wt 148 lb (67.132 kg)  BMI 32.02 kg/m2  SpO2 95%  Wt Readings from Last 3 Encounters:  09/11/14 148 lb (67.132 kg)  03/04/14 139 lb (63.05 kg)  01/27/14 138 lb (62.596 kg)    Physical Exam  Constitutional: She appears well-developed and well-nourished. No distress.  Eyes: EOM are normal. No scleral icterus.  Neck: No thyromegaly present.   Cardiovascular: Normal rate.   Pulmonary/Chest: Effort normal.  Abdominal: She exhibits no distension.  Musculoskeletal:       Right hand: She exhibits swelling. Normal sensation noted. Normal strength noted.       Left hand: She exhibits swelling. Normal sensation noted. Normal strength noted.  Bony swelling with wrist in flexion on extensor surfaces of both hands; palpable mass anteromedial left thigh  Skin: No cyanosis. No pallor. Nails show no clubbing.  Separation of the nail from the nailbed on the left great toe; no thickening, no yellowish; no other toenails appear to be affected  Psychiatric: She has a normal mood and affect. Her behavior is normal. Judgment and thought content normal.   Results for orders placed or performed in visit on 09/08/14  HM COLONOSCOPY  Result Value Ref Range   HM Colonoscopy per PP       Assessment & Plan:   Problem List Items Addressed This Visit      Cardiovascular and Mediastinum   AVM (arteriovenous malformation)    Encouraged her to f/u with Dr. Wyn Quaker, her vascular surgeon      Essential hypertension, benign    Fair control today; low sodium diet encouraged  Musculoskeletal and Integument   Onycholysis of toenail    Explained diagnosis; no evidence of fungal infection at this time; likely related to trauma; monitor; no meds        Genitourinary   CKD (chronic kidney disease) stage 3, GFR 30-59 ml/min    Avoid NSAIDs      Vaginal atrophy    Discussed option of bio-identical HRT; she will check around with different providers to see if something available for her        Other   Bilateral hand pain - Primary    I suspect this is arthritis; no imaging ordered today; will treat conservatively; see AVS          Follow up plan: Return if symptoms worsen or fail to improve.

## 2014-09-15 ENCOUNTER — Ambulatory Visit (INDEPENDENT_AMBULATORY_CARE_PROVIDER_SITE_OTHER): Payer: BLUE CROSS/BLUE SHIELD

## 2014-09-15 DIAGNOSIS — Z23 Encounter for immunization: Secondary | ICD-10-CM

## 2014-09-16 ENCOUNTER — Telehealth: Payer: Self-pay

## 2014-09-16 DIAGNOSIS — M79642 Pain in left hand: Principal | ICD-10-CM

## 2014-09-16 DIAGNOSIS — N952 Postmenopausal atrophic vaginitis: Secondary | ICD-10-CM | POA: Insufficient documentation

## 2014-09-16 DIAGNOSIS — Q2730 Arteriovenous malformation, site unspecified: Secondary | ICD-10-CM | POA: Insufficient documentation

## 2014-09-16 DIAGNOSIS — I10 Essential (primary) hypertension: Secondary | ICD-10-CM | POA: Insufficient documentation

## 2014-09-16 DIAGNOSIS — M79641 Pain in right hand: Secondary | ICD-10-CM | POA: Insufficient documentation

## 2014-09-16 DIAGNOSIS — L601 Onycholysis: Secondary | ICD-10-CM | POA: Insufficient documentation

## 2014-09-16 NOTE — Assessment & Plan Note (Signed)
Discussed option of bio-identical HRT; she will check around with different providers to see if something available for her

## 2014-09-16 NOTE — Assessment & Plan Note (Signed)
Avoid NSAIDs 

## 2014-09-16 NOTE — Assessment & Plan Note (Signed)
Explained diagnosis; no evidence of fungal infection at this time; likely related to trauma; monitor; no meds

## 2014-09-16 NOTE — Assessment & Plan Note (Signed)
I suspect this is arthritis; no imaging ordered today; will treat conservatively; see AVS

## 2014-09-16 NOTE — Assessment & Plan Note (Signed)
Fair control today; low sodium diet encouraged

## 2014-09-16 NOTE — Assessment & Plan Note (Signed)
Encouraged her to f/u with Dr. Wyn Quaker, her vascular surgeon

## 2014-10-07 ENCOUNTER — Encounter: Payer: Self-pay | Admitting: Family Medicine

## 2014-10-07 ENCOUNTER — Ambulatory Visit (INDEPENDENT_AMBULATORY_CARE_PROVIDER_SITE_OTHER): Payer: BLUE CROSS/BLUE SHIELD | Admitting: Family Medicine

## 2014-10-07 VITALS — BP 129/88 | HR 116 | Temp 100.2°F | Wt 149.0 lb

## 2014-10-07 DIAGNOSIS — R509 Fever, unspecified: Secondary | ICD-10-CM | POA: Insufficient documentation

## 2014-10-07 DIAGNOSIS — J0110 Acute frontal sinusitis, unspecified: Secondary | ICD-10-CM | POA: Diagnosis not present

## 2014-10-07 DIAGNOSIS — R059 Cough, unspecified: Secondary | ICD-10-CM

## 2014-10-07 DIAGNOSIS — R05 Cough: Secondary | ICD-10-CM | POA: Diagnosis not present

## 2014-10-07 MED ORDER — AMOXICILLIN 400 MG/5ML PO SUSR: 500.0000 mg | Freq: Three times a day (TID) | ORAL | Status: DC

## 2014-10-07 MED ORDER — ALBUTEROL SULFATE HFA 108 (90 BASE) MCG/ACT IN AERS: 2.0000 | INHALATION_SPRAY | Freq: Four times a day (QID) | RESPIRATORY_TRACT | Status: DC | PRN

## 2014-10-07 NOTE — Assessment & Plan Note (Addendum)
Hx of bilateral pneumonia; breath sounds are clear today; explained she is obviously at risk for pneumonia and to call or seek medical care right away if worsening; did not order CXR today as does not change plan right now and lungs again are CTA; discussed cough meds; I do not want to give her tussionex with her Xanax use (prescribed by another doctor); she does not want to use tessalon perles (hx of hallucinations); will have her check with pharmacist to see if there is something that he or she thinks would be safe for her to use with her multiple allergies and her Xanax use; discussed risk of unintentional overdose with mixing narcotic cough medicine and Xanax

## 2014-10-07 NOTE — Progress Notes (Signed)
BP 129/88 mmHg  Pulse 116  Temp(Src) 100.2 F (37.9 C)  Wt 149 lb (67.586 kg)  SpO2 93%   Subjective:    Patient ID: Kylie [name], female    DOB: 18-Aug-1955, 59 y.o.   MRN: [id number]  HPI: Kylie [name] is a 59 y.o. female  Chief Complaint  Patient presents with  . URI    sinus infection, coughing, chest hurts, coughing up green stuff, head hurts, nose runs. Fever last night of 101. Some nausea and diarrhea.   When she breathes, "oh my"; sore and painful; she thinks she has bronchitis, might have sinusitis too; she has a really bad headache pointing over her forehead; ran a low-grade temp last night; cheap thermometer; but it read 101 degrees; not achy all over; weak but not flu-like; no sore throat; blowing out nasty green stuff; worse in the morning; green at first and then lightens up through the day; ears are bothering her too, left worse than right Cough is nasty; cough is keeping her up; went to the hospital to see a baby a week and a half ago; husband sick too; he went and got antibiotics She used to have an inhaler for her breathing but needs another one  Hx of double pneumonia Hx of smoking, quit 3-4 years ago; smoked 40 years  Relevant past medical, surgical, family and social history reviewed and updated as indicated. Interim medical history since our last visit reviewed. Allergies and medications reviewed and updated. She has multiple allergies.  Review of Systems  Constitutional: Positive for fever. Negative for chills.  HENT: Positive for ear pain (both ears, left worse than right), postnasal drip and sinus pressure (across frontal region, moves to hairline; hurts in the eyes).   Eyes: Positive for pain (behind the eyes over the sinuses) and discharge.  Respiratory: Positive for cough, shortness of breath and wheezing (needs a new inhaler).   Gastrointestinal:       Stomach issues are not new  Skin: Negative for rash.  Hematological: Negative for adenopathy.  Does not bruise/bleed easily.  Per HPI unless specifically indicated above     Objective:    BP 129/88 mmHg  Pulse 116  Temp(Src) 100.2 F (37.9 C)  Wt 149 lb (67.586 kg)  SpO2 93%  Wt Readings from Last 3 Encounters:  10/07/14 149 lb (67.586 kg)  09/11/14 148 lb (67.132 kg)  03/04/14 139 lb (63.05 kg)    Physical Exam  Constitutional: She appears well-developed and well-nourished. No distress (but appears to not feel well; nontoxic).  HENT:  Head: Normocephalic and atraumatic.  Right Ear: Hearing, external ear and ear canal normal. No drainage. Tympanic membrane is injected and erythematous. Tympanic membrane is not retracted and not bulging. No middle ear effusion.  Left Ear: Hearing, external ear and ear canal normal. No drainage. Tympanic membrane is not injected, not erythematous, not retracted and not bulging. A middle ear effusion is present.  Nose: Mucosal edema and rhinorrhea present. No epistaxis. Right sinus exhibits maxillary sinus tenderness and frontal sinus tenderness. Left sinus exhibits maxillary sinus tenderness and frontal sinus tenderness.  Mouth/Throat: Uvula is midline, oropharynx is clear and moist and mucous membranes are normal. Mucous membranes are not dry.  Voice is slightly raspy  Eyes: EOM are normal. No scleral icterus.  Neck: No thyromegaly present.  Cardiovascular: Regular rhythm and normal heart sounds.  Tachycardia present.   No murmur heard. Pulmonary/Chest: Effort normal and breath sounds normal. No accessory muscle usage.  No tachypnea. No respiratory distress. She has no decreased breath sounds. She has no wheezes. She has no rhonchi. She has no rales.  Musculoskeletal: Normal range of motion. She exhibits no edema.  Lymphadenopathy:       Head (right side): No submandibular adenopathy present.       Head (left side): No submandibular adenopathy present.    She has no cervical adenopathy.  Neurological: She is alert. She exhibits normal muscle  tone.  Skin: Skin is warm and dry. No rash noted. She is not diaphoretic. No pallor.  Psychiatric: She has a normal mood and affect. Her behavior is normal. Judgment and thought content normal.    Results for orders placed or performed in visit on 09/08/14  HM COLONOSCOPY  Result Value Ref Range   HM Colonoscopy per PP       Assessment & Plan:   Problem List Items Addressed This Visit      Respiratory   Sinusitis, acute frontal - Primary    New problem; rest and hydration, vit C, green tea; see AVS; start antibiotics; she has several drug allergies but can take amoxicillin, even with her cephalosporin allergy; she requested liquid antibiotic; Rx sent to pharmacy      Relevant Medications   amoxicillin (AMOXIL) 400 MG/5ML suspension     Other   Fever    Only treat if symptoms and feeling badly; explained that fever is part of the body's way of helping to fight infections; see AVS; she is afebrile (technically) here in the office      Cough    Hx of bilateral pneumonia; breath sounds are clear today; explained she is obviously at risk for pneumonia and to call or seek medical care right away if worsening; did not order CXR today as does not change plan right now and lungs again are CTA; discussed cough meds; I do not want to give her tussionex with her Xanax use (prescribed by another doctor); she does not want to use tessalon perles (hx of hallucinations); will have her check with pharmacist to see if there is something that he or she thinks would be safe for her to use with her multiple allergies and her Xanax use; discussed risk of unintentional overdose with mixing narcotic cough medicine and Xanax         Follow up plan: Return in about 2 days (around 10/09/2014), or Friday, for recheck.  Meds ordered this encounter  Medications  . albuterol (PROVENTIL HFA;VENTOLIN HFA) 108 (90 BASE) MCG/ACT inhaler    Sig: Inhale 2 puffs into the lungs every 6 (six) hours as needed for  wheezing or shortness of breath.    Dispense:  1 Inhaler    Refill:  0  . amoxicillin (AMOXIL) 400 MG/5ML suspension    Sig: Take 6.3 mLs (500 mg total) by mouth 3 (three) times daily. For ten days    Dispense:  200 mL    Refill:  0

## 2014-10-07 NOTE — Assessment & Plan Note (Signed)
New problem; rest and hydration, vit C, green tea; see AVS; start antibiotics; she has several drug allergies but can take amoxicillin, even with her cephalosporin allergy; she requested liquid antibiotic; Rx sent to pharmacy

## 2014-10-07 NOTE — Patient Instructions (Addendum)
Try vitamin C (orange juice if not diabetic or vitamin C tablets) and drink green tea to help your immune system during your illness Get plenty of rest and hydration Start the antibiotics Use the inhaler as needed per instructions Ask the pharmacist about a behind-the-counter cough medicine that would be safe to take with your Xanax and also with all of your allergies If you get worse, call or go to urgent care in case you develop pneumonia (worsening fever, worsening cough, etc.)  Fever, Adult A fever is a higher than normal body temperature. In an adult, an oral temperature around 98.6 F (37 C) is considered normal. A temperature of 100.4 F (38 C) or higher is generally considered a fever. Mild or moderate fevers generally have no long-term effects and often do not require treatment. Extreme fever (greater than or equal to 106 F or 41.1 C) can cause seizures. The sweating that may occur with repeated or prolonged fever may cause dehydration. Elderly people can develop confusion during a fever. A measured temperature can vary with:  Age.  Time of day.  Method of measurement (mouth, underarm, rectal, or ear). The fever is confirmed by taking a temperature with a thermometer. Temperatures can be taken different ways. Some methods are accurate and some are not.  An oral temperature is used most commonly. Electronic thermometers are fast and accurate.  An ear temperature will only be accurate if the thermometer is positioned as recommended by the manufacturer.  A rectal temperature is accurate and done for those adults who have a condition where an oral temperature cannot be taken.  An underarm (axillary) temperature is not accurate and not recommended. Fever is a symptom, not a disease.  CAUSES   Infections commonly cause fever.  Some noninfectious causes for fever include:  Some arthritis conditions.  Some thyroid or adrenal gland conditions.  Some immune system  conditions.  Some types of cancer.  A medicine reaction.  High doses of certain street drugs such as methamphetamine.  Dehydration.  Exposure to high outside or room temperatures.  Occasionally, the source of a fever cannot be determined. This is sometimes called a "fever of unknown origin" (FUO).  Some situations may lead to a temporary rise in body temperature that may go away on its own. Examples are:  Childbirth.  Surgery.  Intense exercise. HOME CARE INSTRUCTIONS   Take appropriate medicines for fever. Follow dosing instructions carefully. If you use acetaminophen to reduce the fever, be careful to avoid taking other medicines that also contain acetaminophen. Do not take aspirin for a fever if you are younger than age 57. There is an association with Reye's syndrome. Reye's syndrome is a rare but potentially deadly disease.  If an infection is present and antibiotics have been prescribed, take them as directed. Finish them even if you start to feel better.  Rest as needed.  Maintain an adequate fluid intake. To prevent dehydration during an illness with prolonged or recurrent fever, you may need to drink extra fluid.Drink enough fluids to keep your urine clear or pale yellow.  Sponging or bathing with room temperature water may help reduce body temperature. Do not use ice water or alcohol sponge baths.  Dress comfortably, but do not over-bundle. SEEK MEDICAL CARE IF:   You are unable to keep fluids down.  You develop vomiting or diarrhea.  You are not feeling at least partly better after 3 days.  You develop new symptoms or problems. SEEK IMMEDIATE MEDICAL CARE IF:  You have shortness of breath or trouble breathing.  You develop excessive weakness.  You are dizzy or you faint.  You are extremely thirsty or you are making little or no urine.  You develop new pain that was not there before (such as in the head, neck, chest, back, or abdomen).  You have  persistent vomiting and diarrhea for more than 1 to 2 days.  You develop a stiff neck or your eyes become sensitive to light.  You develop a skin rash.  You have a fever or persistent symptoms for more than 2 to 3 days.  You have a fever and your symptoms suddenly get worse. MAKE SURE YOU:   Understand these instructions.  Will watch your condition.  Will get help right away if you are not doing well or get worse. Document Released: 06/21/2000 Document Revised: 05/12/2013 Document Reviewed: 10/27/2010 [facility] Patient Information 2015 [place], [place]. This information is not intended to replace advice given to you by your health care provider. Make sure you discuss any questions you have with your health care provider.

## 2014-10-07 NOTE — Assessment & Plan Note (Signed)
Only treat if symptoms and feeling badly; explained that fever is part of the body's way of helping to fight infections; see AVS; she is afebrile (technically) here in the office

## 2014-10-09 ENCOUNTER — Ambulatory Visit (INDEPENDENT_AMBULATORY_CARE_PROVIDER_SITE_OTHER): Payer: BLUE CROSS/BLUE SHIELD | Admitting: Family Medicine

## 2014-10-09 ENCOUNTER — Encounter: Payer: Self-pay | Admitting: Family Medicine

## 2014-10-09 ENCOUNTER — Ambulatory Visit
Admission: RE | Admit: 2014-10-09 | Payer: BLUE CROSS/BLUE SHIELD | Source: Ambulatory Visit | Admitting: Family Medicine

## 2014-10-09 VITALS — BP 139/83 | HR 114 | Temp 98.6°F | Wt 150.0 lb

## 2014-10-09 DIAGNOSIS — R509 Fever, unspecified: Secondary | ICD-10-CM | POA: Diagnosis not present

## 2014-10-09 DIAGNOSIS — H0260 Xanthelasma of unspecified eye, unspecified eyelid: Secondary | ICD-10-CM

## 2014-10-09 DIAGNOSIS — R000 Tachycardia, unspecified: Secondary | ICD-10-CM

## 2014-10-09 DIAGNOSIS — J0110 Acute frontal sinusitis, unspecified: Secondary | ICD-10-CM | POA: Diagnosis not present

## 2014-10-09 DIAGNOSIS — R05 Cough: Secondary | ICD-10-CM | POA: Diagnosis not present

## 2014-10-09 DIAGNOSIS — R059 Cough, unspecified: Secondary | ICD-10-CM

## 2014-10-09 LAB — CBC WITH DIFFERENTIAL/PLATELET
Hematocrit: 40.7 % (ref 34.0–46.6)
Hemoglobin: 12.8 g/dL (ref 11.1–15.9)
Lymphocytes Absolute: 2.2 10*3/uL (ref 0.7–3.1)
Lymphs: 25 %
MCH: 27.8 pg (ref 26.6–33.0)
MCHC: 31.4 g/dL — ABNORMAL LOW (ref 31.5–35.7)
MCV: 88 fL (ref 79–97)
MID (Absolute): 1 10*3/uL (ref 0.1–1.6)
MID: 11 %
Neutrophils Absolute: 5.6 10*3/uL (ref 1.4–7.0)
Neutrophils: 64 %
PLATELETS: 306 10*3/uL (ref 150–379)
RBC: 4.61 x10E6/uL (ref 3.77–5.28)
RDW: 14.4 % (ref 12.3–15.4)
WBC: 8.8 10*3/uL (ref 3.4–10.8)

## 2014-10-09 NOTE — Progress Notes (Signed)
BP 139/83 mmHg  Pulse 114  Temp(Src) 98.6 F (37 C)  Wt 150 lb (68.04 kg)  SpO2 94%   Subjective:    Patient ID: Kylie [name], female    DOB: 10/10/1955, 59 y.o.   MRN: [id number]  HPI: Kylie [name] is a 59 y.o. female  Chief Complaint  Patient presents with  . URI    2 day recheck, she still doesn't feel any better.   She ran fevers half the night; she is coughing still; does not have any cough medicine She is still so sick she says She is a little dizzy, but thinks it is from the antibiotics She is having headaches Last note reviewed; she was started on amoxicillin and she has a SABA inhaler to use  Relevant past medical, surgical, family and social history reviewed and updated as indicated. Interim medical history since our last visit reviewed. Allergies and medications reviewed and updated.  Review of Systems  Per HPI unless specifically indicated above     Objective:    BP 139/83 mmHg  Pulse 114  Temp(Src) 98.6 F (37 C)  Wt 150 lb (68.04 kg)  SpO2 94%  Wt Readings from Last 3 Encounters:  10/09/14 150 lb (68.04 kg)  10/07/14 149 lb (67.586 kg)  09/11/14 148 lb (67.132 kg)    Physical Exam  Constitutional: She appears well-developed and well-nourished. No distress.  HENT:  Mouth/Throat: Mucous membranes are not pale and not dry. No oropharyngeal exudate or posterior oropharyngeal edema.  Voice is a little raspy  Cardiovascular: Regular rhythm.   No extrasystoles are present. Tachycardia present.   Pulmonary/Chest: Effort normal and breath sounds normal. No accessory muscle usage. No respiratory distress. She has no decreased breath sounds. She has no wheezes. She has no rhonchi. She has no rales.  Skin: She is not diaphoretic. No pallor.  Xanthelasma right eye area more than left  Psychiatric: She has a normal mood and affect.   Results for orders placed or performed in visit on 10/09/14  CBC With Differential/Platelet  Result Value Ref Range   WBC 8.8 3.4 - 10.8 x10E3/uL   RBC 4.61 3.77 - 5.28 x10E6/uL   Hemoglobin 12.8 11.1 - 15.9 g/dL   Hematocrit 40.9 81.1 - 46.6 %   MCV 88 79 - 97 fL   MCH 27.8 26.6 - 33.0 pg   MCHC 31.4 (L) 31.5 - 35.7 g/dL   RDW 91.4 78.2 - 95.6 %   Platelets 306 150 - 379 x10E3/uL   Neutrophils 64 %   Lymphs 25 %   MID 11 %   Neutrophils Absolute 5.6 1.4 - 7.0 x10E3/uL   Lymphocytes Absolute 2.2 0.7 - 3.1 x10E3/uL   MID (Absolute) 1.0 0.1 - 1.6 X10E3/uL  Comprehensive metabolic panel  Result Value Ref Range   Glucose 111 (H) 65 - 99 mg/dL   BUN 9 6 - 24 mg/dL   Creatinine, Ser 2.13 0.57 - 1.00 mg/dL   GFR calc non Af Amer 67 >59 mL/min/1.73   GFR calc Af Amer 77 >59 mL/min/1.73   BUN/Creatinine Ratio 9 9 - 23   Sodium 139 134 - 144 mmol/L   Potassium 3.9 3.5 - 5.2 mmol/L   Chloride 98 97 - 108 mmol/L   CO2 25 18 - 29 mmol/L   Calcium 8.9 8.7 - 10.2 mg/dL   Total Protein 7.3 6.0 - 8.5 g/dL   Albumin 3.7 3.5 - 5.5 g/dL   Globulin, Total 3.6 1.5 - 4.5 g/dL  Albumin/Globulin Ratio 1.0 (L) 1.1 - 2.5   Bilirubin Total 0.2 0.0 - 1.2 mg/dL   Alkaline Phosphatase 84 39 - 117 IU/L   AST 13 0 - 40 IU/L   ALT 19 0 - 32 IU/L  Lipid Panel w/o Chol/HDL Ratio  Result Value Ref Range   Cholesterol, Total 196 100 - 199 mg/dL   Triglycerides 045 0 - 149 mg/dL   HDL 38 (L) >40 mg/dL   VLDL Cholesterol Cal 23 5 - 40 mg/dL   LDL Calculated 981 (H) 0 - 99 mg/dL  TSH  Result Value Ref Range   TSH 1.280 0.450 - 4.500 uIU/mL      Assessment & Plan:   Problem List Items Addressed This Visit      Respiratory   Sinusitis, acute frontal    Continue antibiotics        Musculoskeletal and Integument   Xanthelasma of eyelid   Relevant Orders   Lipid Panel w/o Chol/HDL Ratio (Completed)     Other   Fever    Check CBC and CXR today      Relevant Orders   CBC With Differential/Platelet (Completed)   Comprehensive metabolic panel (Completed)   DG Chest 2 View   Cough - Primary    Persistent  cough, with SHOB; will get PA and lateral CXR and CBC to look for pneumonia; she had the CBC done, which did not show an elevated white count or left shift; she went to have the chest xray done and was unable to do so (staffing problem, apparently); she returned to the clinic and we mutually agreed to bypass the xray (rather than send her to the hospital to have the xray done), and she'll go home and rest; she may get cough medicine from pharmacist safe to take with elevated heart rate; no cough med Rxd today by me; reasons to seek medical care over the weekend if worsening reviewed      Relevant Orders   CBC With Differential/Platelet (Completed)   DG Chest 2 View   Tachycardia    Hydration encouraged, along with rest      Relevant Orders   TSH (Completed)      Follow up plan: Return if symptoms worsen or fail to improve.

## 2014-10-09 NOTE — Assessment & Plan Note (Addendum)
Persistent cough, with SHOB; will get PA and lateral CXR and CBC to look for pneumonia; she had the CBC done, which did not show an elevated white count or left shift; she went to have the chest xray done and was unable to do so (staffing problem, apparently); she returned to the clinic and we mutually agreed to bypass the xray (rather than send her to the hospital to have the xray done), and she'll go home and rest; she may get cough medicine from pharmacist safe to take with elevated heart rate; no cough med Rxd today by me; reasons to seek medical care over the weekend if worsening reviewed

## 2014-10-09 NOTE — Assessment & Plan Note (Signed)
Check CBC and CXR today

## 2014-10-09 NOTE — Patient Instructions (Signed)
Go from here to [place] for a chest xray Try vitamin C (orange juice if not diabetic or vitamin C tablets) and drink green tea to help your immune system during your illness Get plenty of rest and hydration Continue your current treatments for now

## 2014-10-10 LAB — LIPID PANEL W/O CHOL/HDL RATIO
Cholesterol, Total: 196 mg/dL (ref 100–199)
HDL: 38 mg/dL — ABNORMAL LOW (ref >39)
LDL Calculated: 135 mg/dL — ABNORMAL HIGH (ref 0–99)
Triglycerides: 117 mg/dL (ref 0–149)
VLDL Cholesterol Cal: 23 mg/dL (ref 5–40)

## 2014-10-10 LAB — COMPREHENSIVE METABOLIC PANEL
A/G RATIO: 1 — AB (ref 1.1–2.5)
ALT: 19 IU/L (ref 0–32)
AST: 13 IU/L (ref 0–40)
Albumin: 3.7 g/dL (ref 3.5–5.5)
Alkaline Phosphatase: 84 IU/L (ref 39–117)
BILIRUBIN TOTAL: 0.2 mg/dL (ref 0.0–1.2)
BUN/Creatinine Ratio: 9 (ref 9–23)
BUN: 9 mg/dL (ref 6–24)
CHLORIDE: 98 mmol/L (ref 97–108)
CO2: 25 mmol/L (ref 18–29)
Calcium: 8.9 mg/dL (ref 8.7–10.2)
Creatinine, Ser: 0.95 mg/dL (ref 0.57–1.00)
GFR calc Af Amer: 77 mL/min/{1.73_m2} (ref >59)
GFR calc non Af Amer: 67 mL/min/{1.73_m2} (ref >59)
Globulin, Total: 3.6 g/dL (ref 1.5–4.5)
Glucose: 111 mg/dL — ABNORMAL HIGH (ref 65–99)
POTASSIUM: 3.9 mmol/L (ref 3.5–5.2)
Sodium: 139 mmol/L (ref 134–144)
Total Protein: 7.3 g/dL (ref 6.0–8.5)

## 2014-10-10 LAB — TSH: TSH: 1.28 u[IU]/mL (ref 0.450–4.500)

## 2014-10-11 NOTE — Assessment & Plan Note (Signed)
Hydration encouraged, along with rest

## 2014-10-11 NOTE — Assessment & Plan Note (Signed)
Continue antibiotics

## 2014-10-20 ENCOUNTER — Encounter: Payer: Self-pay | Admitting: Family Medicine

## 2014-12-14 NOTE — Telephone Encounter (Signed)
Done

## 2014-12-21 ENCOUNTER — Other Ambulatory Visit: Payer: Self-pay

## 2014-12-21 MED ORDER — MONTELUKAST SODIUM 10 MG PO TABS: 10.0000 mg | ORAL_TABLET | Freq: Every day | ORAL | Status: DC

## 2014-12-21 NOTE — Telephone Encounter (Signed)
Routing to provider  

## 2014-12-21 NOTE — Telephone Encounter (Signed)
rx approved

## 2014-12-27 ENCOUNTER — Emergency Department
Admission: EM | Admit: 2014-12-27 | Discharge: 2014-12-28 | Disposition: A | Discharge status: Home / Self Care | Payer: BLUE CROSS/BLUE SHIELD | Attending: Emergency Medicine | Admitting: Emergency Medicine

## 2014-12-27 ENCOUNTER — Encounter: Payer: Self-pay | Admitting: Emergency Medicine

## 2014-12-27 ENCOUNTER — Emergency Department: Payer: BLUE CROSS/BLUE SHIELD

## 2014-12-27 DIAGNOSIS — R202 Paresthesia of skin: Secondary | ICD-10-CM | POA: Diagnosis not present

## 2014-12-27 DIAGNOSIS — Z792 Long term (current) use of antibiotics: Secondary | ICD-10-CM | POA: Diagnosis not present

## 2014-12-27 DIAGNOSIS — Z87891 Personal history of nicotine dependence: Secondary | ICD-10-CM | POA: Diagnosis not present

## 2014-12-27 DIAGNOSIS — N183 Chronic kidney disease, stage 3 (moderate): Secondary | ICD-10-CM | POA: Diagnosis not present

## 2014-12-27 DIAGNOSIS — R109 Unspecified abdominal pain: Secondary | ICD-10-CM | POA: Diagnosis not present

## 2014-12-27 DIAGNOSIS — I10 Essential (primary) hypertension: Secondary | ICD-10-CM

## 2014-12-27 DIAGNOSIS — R079 Chest pain, unspecified: Secondary | ICD-10-CM | POA: Insufficient documentation

## 2014-12-27 DIAGNOSIS — I129 Hypertensive chronic kidney disease with stage 1 through stage 4 chronic kidney disease, or unspecified chronic kidney disease: Secondary | ICD-10-CM | POA: Insufficient documentation

## 2014-12-27 DIAGNOSIS — Z91040 Latex allergy status: Secondary | ICD-10-CM | POA: Diagnosis not present

## 2014-12-27 DIAGNOSIS — R200 Anesthesia of skin: Secondary | ICD-10-CM | POA: Insufficient documentation

## 2014-12-27 DIAGNOSIS — G8929 Other chronic pain: Secondary | ICD-10-CM | POA: Insufficient documentation

## 2014-12-27 DIAGNOSIS — Z79899 Other long term (current) drug therapy: Secondary | ICD-10-CM | POA: Insufficient documentation

## 2014-12-27 DIAGNOSIS — R51 Headache: Secondary | ICD-10-CM | POA: Insufficient documentation

## 2014-12-27 LAB — CBC WITH DIFFERENTIAL/PLATELET
BASOS ABS: 0.1 10*3/uL (ref 0–0.1)
Basophils Relative: 1 %
Eosinophils Absolute: 0.5 10*3/uL (ref 0–0.7)
Eosinophils Relative: 5 %
HCT: 46.1 % (ref 35.0–47.0)
Hemoglobin: 14.9 g/dL (ref 12.0–16.0)
LYMPHS PCT: 31 %
Lymphs Abs: 3.4 10*3/uL (ref 1.0–3.6)
MCH: 27.8 pg (ref 26.0–34.0)
MCHC: 32.4 g/dL (ref 32.0–36.0)
MCV: 85.7 fL (ref 80.0–100.0)
Monocytes Absolute: 1 10*3/uL — ABNORMAL HIGH (ref 0.2–0.9)
Monocytes Relative: 9 %
Neutro Abs: 5.9 10*3/uL (ref 1.4–6.5)
Neutrophils Relative %: 54 %
Platelets: 274 10*3/uL (ref 150–440)
RBC: 5.38 MIL/uL — ABNORMAL HIGH (ref 3.80–5.20)
RDW: 14.5 % (ref 11.5–14.5)
WBC: 10.9 10*3/uL (ref 3.6–11.0)

## 2014-12-27 LAB — COMPREHENSIVE METABOLIC PANEL
ALT: 23 U/L (ref 14–54)
AST: 19 U/L (ref 15–41)
Albumin: 4 g/dL (ref 3.5–5.0)
Alkaline Phosphatase: 71 U/L (ref 38–126)
Anion gap: 8 (ref 5–15)
BUN: 16 mg/dL (ref 6–20)
CHLORIDE: 105 mmol/L (ref 101–111)
CO2: 28 mmol/L (ref 22–32)
Calcium: 9.3 mg/dL (ref 8.9–10.3)
Creatinine, Ser: 1.07 mg/dL — ABNORMAL HIGH (ref 0.44–1.00)
GFR calc Af Amer: >60 mL/min (ref >60)
GFR calc non Af Amer: 57 mL/min — ABNORMAL LOW (ref >60)
Glucose, Bld: 103 mg/dL — ABNORMAL HIGH (ref 65–99)
Potassium: 3.8 mmol/L (ref 3.5–5.1)
Sodium: 141 mmol/L (ref 135–145)
TOTAL PROTEIN: 8 g/dL (ref 6.5–8.1)
Total Bilirubin: 0.3 mg/dL (ref 0.3–1.2)

## 2014-12-27 LAB — TROPONIN I
Troponin I: <0.03 ng/mL (ref <0.031)
Troponin I: <0.03 ng/mL (ref <0.031)

## 2014-12-27 MED ORDER — LORAZEPAM 2 MG/ML IJ SOLN
1.0000 mg | Freq: Once | INTRAMUSCULAR | Status: AC
  Administered 2014-12-27: 1 mg via INTRAVENOUS
  Filled 2014-12-27: qty 1

## 2014-12-27 NOTE — ED Notes (Signed)
Per EMS: right face, hand, and leg tingling.  Now c/o both hands and legs numbness, equal grip strength and stroke screen with ems was negative.  Also c/o chest pain 8/10 prior to being given ntg by ems, which brought pain down to 5/10.  BP was decreased from 210/110 to 170/112.  Has chronic abd pain and IBS.  Numbness started at 1730.

## 2014-12-27 NOTE — Discharge Instructions (Signed)
You have been seen in the emergency department today for chest pain, arm tingling/numbness. Your workup has shown normal results. As we discussed please follow-up with your primary care physician in the next 1-2 days for recheck. Return to the emergency department for any further chest pain, trouble breathing, or any other symptom personally concerning to yourself.   Hypertension Hypertension is another name for high blood pressure. High blood pressure forces your heart to work harder to pump blood. A blood pressure reading has two numbers, which includes a higher number over a lower number (example: 110/72). HOME CARE   Have your blood pressure rechecked by your doctor.  Only take medicine as told by your doctor. Follow the directions carefully. The medicine does not work as well if you skip doses. Skipping doses also puts you at risk for problems.  Do not smoke.  Monitor your blood pressure at home as told by your doctor. GET HELP IF:  You think you are having a reaction to the medicine you are taking.  You have repeat headaches or feel dizzy.  You have puffiness (swelling) in your ankles.  You have trouble with your vision. GET HELP RIGHT AWAY IF:   You get a very bad headache and are confused.  You feel weak, numb, or faint.  You get chest or belly (abdominal) pain.  You throw up (vomit).  You cannot breathe very well. MAKE SURE YOU:   Understand these instructions.  Will watch your condition.  Will get help right away if you are not doing well or get worse.   This information is not intended to replace advice given to you by your health care provider. Make sure you discuss any questions you have with your health care provider.   Document Released: 06/14/2007 Document Revised: 12/31/2012 Document Reviewed: 10/18/2012 Elsevier Interactive Patient Education 2016 [employer]  Nonspecific Chest Pain It is often hard to find the cause of chest pain. There is always a  chance that your pain could be related to something serious, such as a heart attack or a blood clot in your lungs. Chest pain can also be caused by conditions that are not life-threatening. If you have chest pain, it is very important to follow up with your doctor.  HOME CARE  If you were prescribed an antibiotic medicine, finish it all even if you start to feel better.  Avoid any activities that cause chest pain.  Do not use any tobacco products, including cigarettes, chewing tobacco, or electronic cigarettes. If you need help quitting, ask your doctor.  Do not drink alcohol.  Take medicines only as told by your doctor.  Keep all follow-up visits as told by your doctor. This is important. This includes any further testing if your chest pain does not go away.  Your doctor may tell you to keep your head raised (elevated) while you sleep.  Make lifestyle changes as told by your doctor. These may include:  Getting regular exercise. Ask your doctor to suggest some activities that are safe for you.  Eating a heart-healthy diet. Your doctor or a diet specialist (dietitian) can help you to learn healthy eating options.  Maintaining a healthy weight.  Managing diabetes, if necessary.  Reducing stress. GET HELP IF:  Your chest pain does not go away, even after treatment.  You have a rash with blisters on your chest.  You have a fever. GET HELP RIGHT AWAY IF:  Your chest pain is worse.  You have an increasing cough,  or you cough up blood.  You have severe belly (abdominal) pain.  You feel extremely weak.  You pass out (faint).  You have chills.  You have sudden, unexplained chest discomfort.  You have sudden, unexplained discomfort in your arms, back, neck, or jaw.  You have shortness of breath at any time.  You suddenly start to sweat, or your skin gets clammy.  You feel nauseous.  You vomit.  You suddenly feel light-headed or dizzy.  Your heart begins to beat  quickly, or it feels like it is skipping beats. These symptoms may be an emergency. Do not wait to see if the symptoms will go away. Get medical help right away. Call your local emergency services (911 in the [place] Do not drive yourself to the hospital.   This information is not intended to replace advice given to you by your health care provider. Make sure you discuss any questions you have with your health care provider.   Document Released: 06/14/2007 Document Revised: 01/16/2014 Document Reviewed: 08/01/2013 Elsevier Interactive Patient Education [employer]

## 2014-12-27 NOTE — ED Provider Notes (Signed)
[facility] Emergency Department Provider Note  Time seen: 7:30 PM  I have reviewed the triage vital signs and the nursing notes.   HISTORY  Chief Complaint Chest Pain; Numbness; and Abdominal Pain    HPI Kylie [name] is a 59 y.o. female with a past medical history of hyperlipidemia, gastric reflux, CK D, hypertension, anxiety, panic attacks, who presents the emergency department with complaints of chest pain, bilateral arm numbness, bilateral leg numbness.According to the patient several hours before presentation she began feeling numbness on the right side of her face, stated this spread to both of her hands she began experiencing some chest pain, and then states the numbness spread down to both of her legs. EMS states initial blood pressure 210 EMS gave 1 nitroglycerin in route to the hospital and her blood pressure was 170 upon arrival. Patient states continued mild chest discomfort but states it is much better than earlier, continued bilateral hand and feet numbness. States her hands were clenched and she was having trouble releasing her hands earlier but now this has resolved. Patient also states some moderate abdominal pain but states this is unchanged from her chronic abdominal pain. States symptom onset around 5 PM.     Past Medical History  Diagnosis Date  . Diverticulitis     hospitalized several times  . Hyperlipidemia   . GERD (gastroesophageal reflux disease)   . Hiatal hernia   . Asthma   . Murmur   . CKD (chronic kidney disease) stage 3, GFR 30-59 ml/min   . Hypercholesteremia   . Heart murmur   . Leaky heart valve   . Hypertension   . Anemia   . Anxiety     followed by Dr. Janeece Riggers  . Depression     followed by Dr. Janeece Riggers  . Panic attacks     followed by Dr. Janeece Riggers  . Overactive bladder   . AVM (arteriovenous malformation) March 2016    distal left thigh    Patient Active Problem List   Diagnosis Date Noted  . Xanthelasma of eyelid  10/09/2014  . Tachycardia 10/09/2014  . Fever 10/07/2014  . Sinusitis, acute frontal 10/07/2014  . Cough 10/07/2014  . AVM (arteriovenous malformation) 09/16/2014  . Essential hypertension, benign 09/16/2014  . Onycholysis of toenail 09/16/2014  . Vaginal atrophy 09/16/2014  . Bilateral hand pain 09/16/2014  . CKD (chronic kidney disease) stage 3, GFR 30-59 ml/min   . Hypercholesteremia   . Anemia   . Sigmoid diverticulitis 09/30/2013    Past Surgical History  Procedure Laterality Date  . Colonoscopy  2013    Dr Wandalee Ferdinand at [facility]  . Upper gi endoscopy  2013    Dr Wandalee Ferdinand at [facility]  . Sigmoid resection / rectopexy  10/17/13  . Appendectomy  2006  . Tubal ligation  1981  . Abdominal hysterectomy  2007    Total with BSO    Current Outpatient Rx  Name  Route  Sig  Dispense  Refill  . albuterol (PROVENTIL HFA;VENTOLIN HFA) 108 (90 BASE) MCG/ACT inhaler   Inhalation   Inhale 2 puffs into the lungs every 6 (six) hours as needed for wheezing or shortness of breath.   1 Inhaler   0   . ALPRAZolam (XANAX) 1 MG tablet   Oral   Take 1 mg by mouth 3 (three) times daily as needed.          Marland Kitchen amoxicillin (AMOXIL) 400 MG/5ML suspension  Oral   Take 6.3 mLs (500 mg total) by mouth 3 (three) times daily. For ten days   200 mL   0   . diltiazem (CARDIZEM) 120 MG tablet   Oral   Take 120 mg by mouth 2 (two) times daily as needed.         . ezetimibe (ZETIA) 10 MG tablet   Oral   Take 10 mg by mouth daily.         . montelukast (SINGULAIR) 10 MG tablet   Oral   Take 1 tablet (10 mg total) by mouth at bedtime.   30 tablet   11     Allergies Cephalexin; Nitrofurantoin; Sulfa antibiotics; Aspirin; Ciprofloxacin; Doxycycline; Ivp dye; Other; Oxycodone; Percocet; Tequin; Latex; Myrbetriq; and Premarin  Family History  Problem Relation Age of Onset  . Cancer Mother     colon  . Kidney disease Mother   . Arthritis Father   . Alcohol abuse Father   . Mental  illness Father   . Diverticulitis Father   . Asthma Sister   . Hyperlipidemia Sister   . Hypertension Sister   . Migraines Sister   . Heart disease Brother   . Hyperlipidemia Brother   . Hypertension Brother     Social History Social History  Substance Use Topics  . Smoking status: Former Smoker -- 2.00 packs/day for 25 years    Types: Cigarettes    Quit date: 01/10/2011  . Smokeless tobacco: Never Used  . Alcohol Use: No    Review of Systems Constitutional: Negative for fever. Cardiovascular: Positive for chest pain, now improved but remains mild. Respiratory: Negative for shortness of breath. Gastrointestinal: Positive for abdominal pain which she states is chronic and unchanged. Negative for nausea, vomiting, diarrhea. Genitourinary: Negative for dysuria. Musculoskeletal: Negative for back pain. Neurological: Mild headache, and describes numbness in all of her arms and legs, denies any one side being worse than the other, feels somewhat weak in bilateral legs. 10-point ROS otherwise negative.  ____________________________________________   PHYSICAL EXAM:  VITAL SIGNS: ED Triage Vitals  Enc Vitals Group     BP 12/27/14 1832 185/103 mmHg     Pulse Rate 12/27/14 1832 102     Resp 12/27/14 1832 17     Temp 12/27/14 1832 98.5 F (36.9 C)     Temp Source 12/27/14 1832 Oral     SpO2 12/27/14 1827 94 %     Weight 12/27/14 1832 157 lb (71.215 kg)     Height 12/27/14 1832 4\' 10"  (1.473 m)     Head Cir --      Peak Flow --      Pain Score 12/27/14 1833 6     Pain Loc --      Pain Edu? --      Excl. in GC? --     Constitutional: Alert and oriented. Well appearing and in no distress. Eyes: Normal exam ENT   Head: Normocephalic and atraumatic.   Mouth/Throat: Mucous membranes are moist. Cardiovascular: Normal rate, regular rhythm. No murmur Respiratory: Normal respiratory effort without tachypnea nor retractions. Breath sounds are clear  Gastrointestinal:  Soft and nontender. No distention. Musculoskeletal: Nontender with normal range of motion in all extremities. No lower extremity tenderness or edema. Neurologic:  Normal speech and language. No gross focal neurologic deficits. Equal grip strengths bilaterally. 5/5 motor in all extremities. Cranial nerves intact without droop. Skin:  Skin is warm, dry and intact.  Psychiatric: Mood and affect are normal.  Speech and behavior are normal  ____________________________________________    EKG  EKG reviewed and interpreted by myself shows sinus tachycardia 103 bpm, narrow QRS, normal axis, normal intervals, no ST changes. Overall normal EKG besides mild tachycardia.  ____________________________________________    RADIOLOGY  CT head shows no acute abnormality  ____________________________________________    INITIAL IMPRESSION / ASSESSMENT AND PLAN / ED COURSE  Pertinent labs & imaging results that were available during my care of the patient were reviewed by me and considered in my medical decision making (see chart for details).  Patient presents the emergency department with symptoms of chest pain, bilateral arm and leg numbness, states symptoms started around 5 PM. States chest pain is much improved at this time but she remains numb in her arms and legs. Overall normal neurologic exam. We will treat with Ativan in the emergency department for possible anxiety while awaiting lab results and CT scan of the head.  Labs are largely within normal limits, troponin is negative.  ____________________________________________   FINAL CLINICAL IMPRESSION(S) / ED DIAGNOSES  Chest pain Paresthesias   Minna Antis, MD 12/30/14 2126

## 2014-12-29 ENCOUNTER — Telehealth: Payer: Self-pay | Admitting: Family Medicine

## 2014-12-29 ENCOUNTER — Ambulatory Visit (INDEPENDENT_AMBULATORY_CARE_PROVIDER_SITE_OTHER): Payer: BLUE CROSS/BLUE SHIELD | Admitting: Family Medicine

## 2014-12-29 ENCOUNTER — Encounter: Payer: Self-pay | Admitting: Family Medicine

## 2014-12-29 VITALS — BP 132/83 | HR 100 | Temp 97.6°F | Wt 152.0 lb

## 2014-12-29 DIAGNOSIS — R208 Other disturbances of skin sensation: Secondary | ICD-10-CM | POA: Diagnosis not present

## 2014-12-29 DIAGNOSIS — R000 Tachycardia, unspecified: Secondary | ICD-10-CM

## 2014-12-29 DIAGNOSIS — Z8679 Personal history of other diseases of the circulatory system: Secondary | ICD-10-CM | POA: Insufficient documentation

## 2014-12-29 DIAGNOSIS — Q2730 Arteriovenous malformation, site unspecified: Secondary | ICD-10-CM

## 2014-12-29 DIAGNOSIS — N183 Chronic kidney disease, stage 3 unspecified: Secondary | ICD-10-CM

## 2014-12-29 DIAGNOSIS — R202 Paresthesia of skin: Secondary | ICD-10-CM

## 2014-12-29 DIAGNOSIS — E7800 Pure hypercholesterolemia, unspecified: Secondary | ICD-10-CM | POA: Diagnosis not present

## 2014-12-29 DIAGNOSIS — I998 Other disorder of circulatory system: Secondary | ICD-10-CM | POA: Diagnosis not present

## 2014-12-29 DIAGNOSIS — J0130 Acute sphenoidal sinusitis, unspecified: Secondary | ICD-10-CM

## 2014-12-29 DIAGNOSIS — R1084 Generalized abdominal pain: Secondary | ICD-10-CM | POA: Diagnosis not present

## 2014-12-29 DIAGNOSIS — I10 Essential (primary) hypertension: Secondary | ICD-10-CM | POA: Diagnosis not present

## 2014-12-29 DIAGNOSIS — R197 Diarrhea, unspecified: Secondary | ICD-10-CM | POA: Diagnosis not present

## 2014-12-29 DIAGNOSIS — R011 Cardiac murmur, unspecified: Secondary | ICD-10-CM | POA: Diagnosis not present

## 2014-12-29 DIAGNOSIS — R200 Anesthesia of skin: Secondary | ICD-10-CM

## 2014-12-29 MED ORDER — AMOXICILLIN 875 MG PO TABS: 875.0000 mg | ORAL_TABLET | Freq: Two times a day (BID) | ORAL | Status: DC

## 2014-12-29 MED ORDER — METOPROLOL SUCCINATE ER 25 MG PO TB24: 25.0000 mg | ORAL_TABLET | Freq: Every day | ORAL | Status: DC

## 2014-12-29 MED ORDER — SIMVASTATIN 10 MG PO TABS: 10.0000 mg | ORAL_TABLET | Freq: Every day | ORAL | Status: DC

## 2014-12-29 NOTE — Assessment & Plan Note (Addendum)
Controlled today; suspect the high pressures in the ER were related to her emotional state; she will talk with her psychiatrist about her recent events; with hx of angina, work-up in ER focused on cardiac issues, will start beta-blocker; she will work on losing a few pounds

## 2014-12-29 NOTE — Assessment & Plan Note (Signed)
Borderline tachycardia here at check-in, only slightly less than 100 during exam; HR 102 and 103 per ER records; will start beta-blocker to reduce overall cardiac oxygen demand

## 2014-12-29 NOTE — Progress Notes (Signed)
BP 132/83 mmHg  Pulse 100  Temp(Src) 97.6 F (36.4 C)  Wt 152 lb (68.947 kg)  SpO2 94%   Subjective:    Patient ID: Kylie [name], female    DOB: 1955/12/18, 59 y.o.   MRN: [id number]  HPI: Kylie [name] is a 59 y.o. female  Chief Complaint  Patient presents with  . Follow-up    She had episode of super high blood pressure and stroke like symptoms and her husband called 911 on Sunday. She was given Nitro by EMS and that didn't help. They ruled out a stroke and heart attack and told her it was anxiety and gave her Valium which she says instantly lowered her BP.   She was in the ER two nights ago She was stressed and upset with her grandson; she got stressed out; her right hand withered up and got white spots in her palm, then had numbness up the right hand and arm and leg; the right side of her face felt weird and she had trouble talking; they called EMS; they scanned her head  She used to be on NTG for angina; she does not have any of NTG left; they gave her some for this but it didn't help; they gave her something in her IV and that's what worked; they told her to f/u with a cardiologist  We reviewed her records from the ER; her BP was 185/103 with heart rate of 102; she had a normal neuro exam; EKG was normal except for HR 103; they gave her ativan and she said it worked; troponin I x 2 negative; she has alprazolam from her psychiatrist; she will talk to him about the recent stress   Troponin I <0.031 ng/mL <0.03 <0.03CM   Comments:     NO INDICATION OF  MYOCARDIAL INJURY.          CT scan reviewed today: CLINICAL DATA: RIGHT-sided face, hand and leg tingling. This started earlier today. Hypertension.  EXAM: CT HEAD WITHOUT CONTRAST  TECHNIQUE: Contiguous axial images were obtained from the base of the skull through the vertex without intravenous contrast.  COMPARISON: None.  FINDINGS: No evidence for acute infarction, hemorrhage, mass  lesion, hydrocephalus, or extra-axial fluid. Slight hypoattenuation of white matter, possible chronic microvascular ischemic change. No signs of large vessel occlusion. Calvarium intact. Vascular calcification. Layering fluid in the sphenoid sinus suggesting acuity. No mastoid fluid.  IMPRESSION: Suspected early small vessel disease. No acute intracranial findings.  We reviewed her other labs; RBC 5.38, normal MCV and MCH, monocytes 1.0  We looked at her cholesterol from Sept 30, 2016 Cholesterol, Total 100 - 199 mg/dL 409   Triglycerides 0 - 149 mg/dL 811   HDL >91 mg/dL 38 (L)   Comments: According to ATP-III Guidelines, HDL-C >59 mg/dL is considered a  negative risk factor for CHD.     VLDL Cholesterol Cal 5 - 40 mg/dL 23   LDL Calculated 0 - 99 mg/dL 478 (H)       She cannot take atoravastatin; she took zetia a few times, but just stopped; had some side effects (dizziness) She cannot take crestor Aspirin is listed in her allergies, causing stomach issues only  Relevant past medical, surgical, family and social history reviewed and updated as indicated. Interim medical history since our last visit reviewed. Allergies and medications reviewed and updated.  Review of Systems  HENT:       Sinus problems  Gastrointestinal: Positive for abdominal pain (LLQ) and diarrhea.  Per HPI unless specifically indicated above     Objective:    BP 132/83 mmHg  Pulse 100  Temp(Src) 97.6 F (36.4 C)  Wt 152 lb (68.947 kg)  SpO2 94%  Wt Readings from Last 3 Encounters:  12/29/14 152 lb (68.947 kg)  12/27/14 157 lb (71.215 kg)  10/09/14 150 lb (68.04 kg)    Physical Exam  Constitutional: She appears well-developed and well-nourished. No distress.  Short-statured female  HENT:  Mouth/Throat: Mucous membranes are not pale and not dry. No oropharyngeal exudate or posterior oropharyngeal edema.  Neck: No thyromegaly present.  Cardiovascular: Normal rate and regular rhythm.    No extrasystoles are present.  No murmur heard. Heart rate just under 100 at time of exam  Pulmonary/Chest: Effort normal and breath sounds normal. No accessory muscle usage. No respiratory distress. She has no decreased breath sounds. She has no wheezes. She has no rhonchi. She has no rales.  Abdominal: Soft. Bowel sounds are normal. She exhibits no distension. There is tenderness (mild) in the left lower quadrant.  Neurological: She displays no tremor. Gait normal.  No gross neurologic deficit; no facial asymmetry; speech is not slurred  Skin: Skin is warm. No rash noted. She is not diaphoretic. No pallor.  Xanthelasma right eye area more than left  Psychiatric: She has a normal mood and affect.  Very pleasant   Results for orders placed or performed during the hospital encounter of 12/27/14  Comprehensive metabolic panel  Result Value Ref Range   Sodium 141 135 - 145 mmol/L   Potassium 3.8 3.5 - 5.1 mmol/L   Chloride 105 101 - 111 mmol/L   CO2 28 22 - 32 mmol/L   Glucose, Bld 103 (H) 65 - 99 mg/dL   BUN 16 6 - 20 mg/dL   Creatinine, Ser 1.611.07 (H) 0.44 - 1.00 mg/dL   Calcium 9.3 8.9 - 09.610.3 mg/dL   Total Protein 8.0 6.5 - 8.1 g/dL   Albumin 4.0 3.5 - 5.0 g/dL   AST 19 15 - 41 U/L   ALT 23 14 - 54 U/L   Alkaline Phosphatase 71 38 - 126 U/L   Total Bilirubin 0.3 0.3 - 1.2 mg/dL   GFR calc non Af Amer 57 (L) >60 mL/min   GFR calc Af Amer >60 >60 mL/min   Anion gap 8 5 - 15  Troponin I  Result Value Ref Range   Troponin I <0.03 <0.031 ng/mL  CBC with Differential  Result Value Ref Range   WBC 10.9 3.6 - 11.0 K/uL   RBC 5.38 (H) 3.80 - 5.20 MIL/uL   Hemoglobin 14.9 12.0 - 16.0 g/dL   HCT 04.546.1 40.935.0 - 81.147.0 %   MCV 85.7 80.0 - 100.0 fL   MCH 27.8 26.0 - 34.0 pg   MCHC 32.4 32.0 - 36.0 g/dL   RDW 91.414.5 78.211.5 - 95.614.5 %   Platelets 274 150 - 440 K/uL   Neutrophils Relative % 54 %   Neutro Abs 5.9 1.4 - 6.5 K/uL   Lymphocytes Relative 31 %   Lymphs Abs 3.4 1.0 - 3.6 K/uL    Monocytes Relative 9 %   Monocytes Absolute 1.0 (H) 0.2 - 0.9 K/uL   Eosinophils Relative 5 %   Eosinophils Absolute 0.5 0 - 0.7 K/uL   Basophils Relative 1 %   Basophils Absolute 0.1 0 - 0.1 K/uL  Troponin I  Result Value Ref Range   Troponin I <0.03 <0.031 ng/mL  Assessment & Plan:   Problem List Items Addressed This Visit      Cardiovascular and Mediastinum   AVM (arteriovenous malformation)    Patient says she hasn't gone back to see her vascular doctor again about this      Relevant Medications   metoprolol succinate (TOPROL XL) 25 MG 24 hr tablet   simvastatin (ZOCOR) 10 MG tablet   Essential hypertension, benign    Controlled today; suspect the high pressures in the ER were related to her emotional state; she will talk with her psychiatrist about her recent events; with hx of angina, work-up in ER focused on cardiac issues, will start beta-blocker; she will work on losing a few pounds      Relevant Medications   metoprolol succinate (TOPROL XL) 25 MG 24 hr tablet   simvastatin (ZOCOR) 10 MG tablet   Vascular calcification    Used a cross-sectional model to explain arterial atherosclerosis; goal LDL is now less than 70; goal HDL 40+; weight loss will help; start statin; if not tolerated, stop it and call me; reassess lipids and SGPT in 6 weeks      Relevant Medications   metoprolol succinate (TOPROL XL) 25 MG 24 hr tablet   simvastatin (ZOCOR) 10 MG tablet   Other Relevant Orders   Ambulatory referral to Cardiology   Ambulatory referral to Neurology     Respiratory   Acute sphenoidal sinusitis    Noted on CT scan of head Dec 2016; start amox; however, we are also checking stool for C diff; notify me of any worsening      Relevant Medications   amoxicillin (AMOXIL) 875 MG tablet     Genitourinary   CKD (chronic kidney disease) stage 3, GFR 30-59 ml/min    GFR 57 per ER labs two days ago        Other   Hypercholesteremia    LDL 135 Sept 2016; new goal  LDL is less than 70; start statin; recheck fasting lipids and SPGT in 6 weeks; stop statin if not tolerated; patient will try to lose modest weight      Relevant Medications   metoprolol succinate (TOPROL XL) 25 MG 24 hr tablet   simvastatin (ZOCOR) 10 MG tablet   Tachycardia    Borderline tachycardia here at check-in, only slightly less than 100 during exam; HR 102 and 103 per ER records; will start beta-blocker to reduce overall cardiac oxygen demand      Heart murmur   Relevant Orders   Ambulatory referral to Cardiology   Echocardiogram   Hx of angina pectoris    It sounds like the focus of ER work-up was on cardiac issues; patient's main complaint to me is short-lived neuro symptoms; will refer to cardiologist though at recommendation of ER; start aspirin and statin and beta-blocker      Relevant Orders   Ambulatory referral to Cardiology   Facial numbness    Seen in the ER Dec 2016; head CT reviewed today; will get carotids and refer to neuro; sounds like she had a pure sensory TIA (left VPL of thalamus?); start aspirin, but STOP if not tolerated      Relevant Orders   US Carotid Duplex Bilateral   Ambulatory referral to Neurology   Echocardiogram   Right arm numbness - Primary    Possible pure sensory TIA; start aspirin, statin, control pressure, get carotids, refer to neuro      Relevant Orders   US Carotid Duplex Bilateral  Ambulatory referral to Neurology   Echocardiogram   Right leg numbness    Possible pure sensory TIA; start aspirin, statin, control pressure, get carotids, refer to neuro      Relevant Orders   US Carotid Duplex Bilateral   Ambulatory referral to Neurology   Echocardiogram    Other Visit Diagnoses    Diarrhea, unspecified type        check C diff; nothing on exam to suggest PSBO    Relevant Orders    Stool C-Diff Toxin Assay    Generalized abdominal pain        hx of diverticulitis; patient to go to the ER if getting worse         Follow up plan: Return in about 6 weeks (around 02/09/2015) for fasting labs in-house and visit with Dr. Sherie [name], and 1 week w/Dr. Sherie [name].  Meds ordered this encounter  Medications  . metoprolol succinate (TOPROL XL) 25 MG 24 hr tablet    Sig: Take 1 tablet (25 mg total) by mouth daily.    Dispense:  30 tablet    Refill:  1  . simvastatin (ZOCOR) 10 MG tablet    Sig: Take 1 tablet (10 mg total) by mouth at bedtime.    Dispense:  30 tablet    Refill:  1  . amoxicillin (AMOXIL) 875 MG tablet    Sig: Take 1 tablet (875 mg total) by mouth 2 (two) times daily.    Dispense:  20 tablet    Refill:  0   An after-visit summary was printed and given to the patient at check-out.  Please see the patient instructions which may contain other information and recommendations beyond what is mentioned above in the assessment and plan.  Orders Placed This Encounter  Procedures  . Stool C-Diff Toxin Assay  . US Carotid Duplex Bilateral  . Ambulatory referral to Cardiology  . Ambulatory referral to Neurology  . Echocardiogram   Face-to-face time with patient was more than 40 minutes, >50% time spent counseling and coordination of care

## 2014-12-29 NOTE — Assessment & Plan Note (Signed)
Possible pure sensory TIA; start aspirin, statin, control pressure, get carotids, refer to neuro 

## 2014-12-29 NOTE — Assessment & Plan Note (Signed)
Patient says she hasn't gone back to see her vascular doctor again about this

## 2014-12-29 NOTE — Assessment & Plan Note (Signed)
Noted on CT scan of head Dec 2016; start amox; however, we are also checking stool for C diff; notify me of any worsening

## 2014-12-29 NOTE — Assessment & Plan Note (Signed)
LDL 135 Sept 2016; new goal LDL is less than 70; start statin; recheck fasting lipids and SPGT in 6 weeks; stop statin if not tolerated; patient will try to lose modest weight

## 2014-12-29 NOTE — Telephone Encounter (Signed)
Please call patient After she left, I did more reviewing of her chart and hospital record I'd like to do more testing regarding the numbness she had in her face and arm and leg; we'll have her see a neurologist and get an echo of her heart and a scan of the arteries in her neck Make sure she is taking the aspirin and statin and beta-blocker Call us with any problems If neurologic symptoms recur, call 911

## 2014-12-29 NOTE — Assessment & Plan Note (Signed)
Possible pure sensory TIA; start aspirin, statin, control pressure, get carotids, refer to neuro

## 2014-12-29 NOTE — Assessment & Plan Note (Signed)
Used a cross-sectional model to explain arterial atherosclerosis; goal LDL is now less than 70; goal HDL 40+; weight loss will help; start statin; if not tolerated, stop it and call me; reassess lipids and SGPT in 6 weeks

## 2014-12-29 NOTE — Assessment & Plan Note (Signed)
Seen in the ER Dec 2016; head CT reviewed today; will get carotids and refer to neuro; sounds like she had a pure sensory TIA (left VPL of thalamus?); start aspirin, but STOP if not tolerated

## 2014-12-29 NOTE — Patient Instructions (Addendum)
We'll have you see the cardiologist Start the metoprolol (Toprol) for blood pressure Do not take the other blood pressure pill Do not take zetia Start the low dose simvastatin for cholesterol Stop it and take benadryl and call medical help if any reactions Recheck cholesterol in [redacted] weeks along with liver test (come fasting for that) Start the antibiotics Please do eat yogurt daily or take a probiotic daily for the next month or two We want to replace the healthy germs in the gut If you notice foul, watery diarrhea in the next two months, schedule an appointment RIGHT AWAY Return in one week Check out the information at [email or web address] entitled "What It Takes to Lose Weight" Try to lose between 1-2 pounds per week by taking in fewer calories and burning off more calories You can succeed by limiting portions, limiting foods dense in calories and fat, becoming more active, and drinking 8 glasses of water a day (64 ounces) Don't skip meals, especially breakfast, as skipping meals may alter your metabolism Do not use over-the-counter weight loss pills or gimmicks that claim rapid weight loss A healthy BMI (or body mass index) is between 18.5 and 24.9 You can calculate your ideal BMI at the NIH website [employer]huhttp://www.nhlbi.nih.gov/health/educational/lose_wt/BMI/bmicalc.htm Return the stool tests as soon as you can Return to the ER if abdominal pain gets worse If you ever have signs or symptoms of stroke, call 911 I would suggest a BABY coated 81 mg aspirin daily for stroke prevention

## 2014-12-29 NOTE — Assessment & Plan Note (Addendum)
It sounds like the focus of ER work-up was on cardiac issues; patient's main complaint to me is short-lived neuro symptoms; will refer to cardiologist though at recommendation of ER; start aspirin and statin and beta-blocker

## 2014-12-29 NOTE — Assessment & Plan Note (Signed)
GFR 57 per ER labs two days ago

## 2014-12-31 NOTE — Telephone Encounter (Signed)
Left message to call.

## 2014-12-31 NOTE — Telephone Encounter (Signed)
Patient notified. She has not picked up the Aspirin yet, but she will today. She has appointment with Cardiology for echo tomorrow and carotid u/s is Dec. 27th. Neurology called her yesterday and said they'd have to call her back with the appointment. I advised her if she had any more symptoms to call 911, she understood and agreed. She was very appreciative that we are so concerned about her and that we are getting everything checked out.

## 2014-12-31 NOTE — Telephone Encounter (Signed)
AMY -- sent with high priority; please call her today Thank you

## 2015-01-05 ENCOUNTER — Ambulatory Visit
Admission: RE | Admit: 2015-01-05 | Discharge: 2015-01-05 | Disposition: A | Discharge status: Home / Self Care | Payer: BLUE CROSS/BLUE SHIELD | Source: Ambulatory Visit | Attending: Family Medicine | Admitting: Family Medicine

## 2015-01-05 ENCOUNTER — Telehealth: Payer: Self-pay | Admitting: Family Medicine

## 2015-01-05 DIAGNOSIS — R011 Cardiac murmur, unspecified: Secondary | ICD-10-CM | POA: Insufficient documentation

## 2015-01-05 DIAGNOSIS — I6523 Occlusion and stenosis of bilateral carotid arteries: Secondary | ICD-10-CM | POA: Diagnosis not present

## 2015-01-05 DIAGNOSIS — R208 Other disturbances of skin sensation: Secondary | ICD-10-CM | POA: Insufficient documentation

## 2015-01-05 DIAGNOSIS — R202 Paresthesia of skin: Secondary | ICD-10-CM | POA: Insufficient documentation

## 2015-01-05 MED ORDER — ASPIRIN EC 81 MG PO TBEC: 81.0000 mg | DELAYED_RELEASE_TABLET | Freq: Every day | ORAL | Status: DC

## 2015-01-05 MED ORDER — ASPIRIN EC 81 MG PO TBEC: 324.0000 mg | DELAYED_RELEASE_TABLET | Freq: Every day | ORAL | Status: DC

## 2015-01-05 NOTE — Progress Notes (Signed)
*  PRELIMINARY RESULTS* Echocardiogram 2D Echocardiogram has been performed.  Kylie [name] 01/05/2015, 11:22 AM

## 2015-01-05 NOTE — Telephone Encounter (Signed)
I told patient carotid results; she had some numbness in her arm; on 81 mg of aspirin; increase to four of those (324 mg) daily She went to see Dr. Juliann Paresallwood; she is going to do the nuclear stress test; he put her on heart medicine and he is treating her like a heart patient She has an appt for her brain soon appt tomorrow

## 2015-01-06 ENCOUNTER — Ambulatory Visit (INDEPENDENT_AMBULATORY_CARE_PROVIDER_SITE_OTHER): Payer: BLUE CROSS/BLUE SHIELD | Admitting: Family Medicine

## 2015-01-06 ENCOUNTER — Encounter: Payer: Self-pay | Admitting: Family Medicine

## 2015-01-06 VITALS — BP 144/85 | HR 108 | Temp 98.1°F | Wt 152.0 lb

## 2015-01-06 DIAGNOSIS — R208 Other disturbances of skin sensation: Secondary | ICD-10-CM

## 2015-01-06 DIAGNOSIS — I208 Other forms of angina pectoris: Secondary | ICD-10-CM

## 2015-01-06 DIAGNOSIS — R000 Tachycardia, unspecified: Secondary | ICD-10-CM | POA: Diagnosis not present

## 2015-01-06 DIAGNOSIS — R200 Anesthesia of skin: Secondary | ICD-10-CM

## 2015-01-06 DIAGNOSIS — J0130 Acute sphenoidal sinusitis, unspecified: Secondary | ICD-10-CM | POA: Diagnosis not present

## 2015-01-06 DIAGNOSIS — I10 Essential (primary) hypertension: Secondary | ICD-10-CM

## 2015-01-06 DIAGNOSIS — I2089 Other forms of angina pectoris: Secondary | ICD-10-CM

## 2015-01-06 MED ORDER — METOPROLOL SUCCINATE ER 25 MG PO TB24: 50.0000 mg | ORAL_TABLET | Freq: Every day | ORAL | Status: DC

## 2015-01-06 NOTE — Assessment & Plan Note (Signed)
Finish up antibiotics 

## 2015-01-06 NOTE — Progress Notes (Signed)
BP 144/85 mmHg  Pulse 108  Temp(Src) 98.1 F (36.7 C)  Wt 152 lb (68.947 kg)  SpO2 93%   Subjective:    Patient ID: Kylie [name], female    DOB: [birth year], 57 y.o.   MRN: [id number]  HPI: Kylie [name] is a 59 y.o. female  Chief Complaint  Patient presents with  . Follow-up    1 week follow up, She had her echo and carotid u/s yesterday. She goes the Neurology tomorrow.   She had chest pain this morning, that happened after she got up this morning; she moved around a little with the dogs; about 20 minutes after laying down, her chest started to feel tight and her left arm was tingling, that lasted ten minutes but it went away on its own; she has seen Dr. Juliann Pares already and he is treating her like a heart patient; she is going to have a stress test January 3rd; he gave her a pill for her heart, isosorbide mononitrate 30 mg daily and she is taking that every day; she does get bad headaches; he did not give her short-acting NTG to use PRN though; also taking beta-blocker; started cholesterol medicine just recently; last LDL was 135 in September 2016; recheck that lipid panel 6 weeks after starting the statin; not smoking now, around passive smoke on clothing She had five minutes of chest pain yesterday, unprovoked  Father had artificial valve in his heart in his 48's  She goes to see the neurologist tomorrow morning; did have some numbness left arm yesterday; we increased her aspirin last night; no trouble speaking; did have some possible trouble swallowing but thinks that might be anxiety  She is under stress at her house; teenager at home; psychiatrist had talked about her starting Abilify with the Xanax; feels overwhelmed at time; no thoughts of self-harm or hurting others  Less than 50% carotid atherosclerosis on carotid scan; we reviewed that today  Relevant past medical, surgical, family and social history reviewed and updated as indicated Interim medical history since our  last visit reviewed Allergies and medications reviewed and updated  Review of Systems  Cardiovascular: Positive for leg swelling (at times; leaves elastic marks from socks at times; avoiding salt).  Skin: Positive for rash (hives broke out christmas morning; took two benadryls; no lip swelling, just whelps).  Per HPI unless specifically indicated above     Objective:    BP 144/85 mmHg  Pulse 108  Temp(Src) 98.1 F (36.7 C)  Wt 152 lb (68.947 kg)  SpO2 93%  Wt Readings from Last 3 Encounters:  01/07/15 152 lb (68.947 kg)  01/06/15 152 lb (68.947 kg)  12/29/14 152 lb (68.947 kg)    Physical Exam  HENT:  Mouth/Throat: Mucous membranes are not dry.  Eyes: EOM are normal. No scleral icterus.  Neck: No thyromegaly present.  Cardiovascular: Regular rhythm.   No extrasystoles are present. Tachycardia present.   Pulmonary/Chest: Effort normal and breath sounds normal. She has no decreased breath sounds. She has no wheezes.  Abdominal: She exhibits no distension.  obese  Musculoskeletal: She exhibits no edema.  Neurological: She displays no tremor. No cranial nerve deficit or sensory deficit. She exhibits normal muscle tone.  Reflex Scores:      Patellar reflexes are 1+ on the right side and 1+ on the left side. Skin: She is not diaphoretic. No pallor.  Psychiatric: Her mood appears not anxious. Her speech is not slurred. She does not exhibit a depressed mood.  Results for orders placed or performed during the hospital encounter of 12/27/14  Comprehensive metabolic panel  Result Value Ref Range   Sodium 141 135 - 145 mmol/L   Potassium 3.8 3.5 - 5.1 mmol/L   Chloride 105 101 - 111 mmol/L   CO2 28 22 - 32 mmol/L   Glucose, Bld 103 (H) 65 - 99 mg/dL   BUN 16 6 - 20 mg/dL   Creatinine, Ser 1.61 (H) 0.44 - 1.00 mg/dL   Calcium 9.3 8.9 - 09.6 mg/dL   Total Protein 8.0 6.5 - 8.1 g/dL   Albumin 4.0 3.5 - 5.0 g/dL   AST 19 15 - 41 U/L   ALT 23 14 - 54 U/L   Alkaline Phosphatase  71 38 - 126 U/L   Total Bilirubin 0.3 0.3 - 1.2 mg/dL   GFR calc non Af Amer 57 (L) >60 mL/min   GFR calc Af Amer >60 >60 mL/min   Anion gap 8 5 - 15  Troponin I  Result Value Ref Range   Troponin I <0.03 <0.031 ng/mL  CBC with Differential  Result Value Ref Range   WBC 10.9 3.6 - 11.0 K/uL   RBC 5.38 (H) 3.80 - 5.20 MIL/uL   Hemoglobin 14.9 12.0 - 16.0 g/dL   HCT 04.5 40.9 - 81.1 %   MCV 85.7 80.0 - 100.0 fL   MCH 27.8 26.0 - 34.0 pg   MCHC 32.4 32.0 - 36.0 g/dL   RDW 91.4 78.2 - 95.6 %   Platelets 274 150 - 440 K/uL   Neutrophils Relative % 54 %   Neutro Abs 5.9 1.4 - 6.5 K/uL   Lymphocytes Relative 31 %   Lymphs Abs 3.4 1.0 - 3.6 K/uL   Monocytes Relative 9 %   Monocytes Absolute 1.0 (H) 0.2 - 0.9 K/uL   Eosinophils Relative 5 %   Eosinophils Absolute 0.5 0 - 0.7 K/uL   Basophils Relative 1 %   Basophils Absolute 0.1 0 - 0.1 K/uL  Troponin I  Result Value Ref Range   Troponin I <0.03 <0.031 ng/mL      Assessment & Plan:   Problem List Items Addressed This Visit      Cardiovascular and Mediastinum   Essential hypertension, benign    Increase beta-blocker to 50 mg total per day; not at goal; weight loss, DASH gudielines would likely help as well      Relevant Medications   metoprolol succinate (TOPROL XL) 25 MG 24 hr tablet   Angina at [facility]) - Primary    I talked with cardiologist about what sounds like crescendo angina; her primary cardiologist is unavailable this week and I am simply not comfortable waiting for her stress test until next week; he kindly agrees to see her today; patient will go there for evaluation to see if additional testing or cardiac cath needed promptly; she is pain-free at the present time; increase beta-blocker to decrease cardiac oxygen consumption      Relevant Medications   metoprolol succinate (TOPROL XL) 25 MG 24 hr tablet     Respiratory   Acute sphenoidal sinusitis    Finish up antibiotics        Other   Tachycardia     Increase beta-blocker from 25 mg daily to 50 mg daily; she will be seeing cardiologist later today      Facial numbness    Patient will be seeing neurologist soon; carotid scan reviewed with her; no urgent need for vascular consult  based on test results; she is now on 325 mg aspirin instead of just 81 mg aspirin         Follow up plan: Return in 5 weeks (on 02/09/2015) for follow-up, please come fasting.  An after-visit summary was printed and given to the patient at check-out.  Please see the patient instructions which may contain other information and recommendations beyond what is mentioned above in the assessment and plan.

## 2015-01-06 NOTE — Assessment & Plan Note (Addendum)
Increase beta-blocker to 50 mg total per day; not at goal; weight loss, DASH gudielines would likely help as well

## 2015-01-06 NOTE — Patient Instructions (Addendum)
Increase the metoprolol from 25 mg daily to 50 mg daily, and take that extra dose as soon as you get home Continue aspirin 324 mg daily (four baby aspirin) Go see Dr. Gwen PoundsKowalski today for 3:00 pm appointment, arrive early if possible If you get another episode of chest pain, call 911 If you develop signs of symptoms of stroke, call 911 Do not start Abilify or any similar medicine until you are cleared by the cardiologist If you need something for aches or pains, try to use Tylenol (acetaminphen) instead of non-steroidals (which include Aleve, ibuprofen, Advil, Motrin, and naproxen); non-steroidals can cause long-term kidney damage and rarely precipitate heart attacks See the neurologist tomorrow Return to see me in late January and come fasting and we'll recheck your cholesterol (appointment already scheduled)

## 2015-01-06 NOTE — Assessment & Plan Note (Addendum)
I talked with cardiologist about what sounds like crescendo angina; her primary cardiologist is unavailable this week and I am simply not comfortable waiting for her stress test until next week; he kindly agrees to see her today; patient will go there for evaluation to see if additional testing or cardiac cath needed promptly; she is pain-free at the present time; increase beta-blocker to decrease cardiac oxygen consumption

## 2015-01-07 ENCOUNTER — Encounter
Admission: RE | Disposition: A | Discharge status: Home / Self Care | Payer: Self-pay | Source: Ambulatory Visit | Attending: Internal Medicine

## 2015-01-07 ENCOUNTER — Ambulatory Visit
Admission: RE | Admit: 2015-01-07 | Discharge: 2015-01-07 | Disposition: A | Discharge status: Home / Self Care | Payer: BLUE CROSS/BLUE SHIELD | Source: Ambulatory Visit | Attending: Internal Medicine | Admitting: Internal Medicine

## 2015-01-07 ENCOUNTER — Encounter: Payer: Self-pay | Admitting: *Deleted

## 2015-01-07 DIAGNOSIS — Z885 Allergy status to narcotic agent status: Secondary | ICD-10-CM | POA: Insufficient documentation

## 2015-01-07 DIAGNOSIS — I200 Unstable angina: Secondary | ICD-10-CM | POA: Diagnosis present

## 2015-01-07 DIAGNOSIS — I25119 Atherosclerotic heart disease of native coronary artery with unspecified angina pectoris: Secondary | ICD-10-CM | POA: Diagnosis not present

## 2015-01-07 DIAGNOSIS — Z882 Allergy status to sulfonamides status: Secondary | ICD-10-CM | POA: Insufficient documentation

## 2015-01-07 DIAGNOSIS — Z888 Allergy status to other drugs, medicaments and biological substances status: Secondary | ICD-10-CM | POA: Diagnosis not present

## 2015-01-07 DIAGNOSIS — Z7982 Long term (current) use of aspirin: Secondary | ICD-10-CM | POA: Insufficient documentation

## 2015-01-07 DIAGNOSIS — I10 Essential (primary) hypertension: Secondary | ICD-10-CM | POA: Diagnosis not present

## 2015-01-07 DIAGNOSIS — Z79899 Other long term (current) drug therapy: Secondary | ICD-10-CM | POA: Insufficient documentation

## 2015-01-07 DIAGNOSIS — Z881 Allergy status to other antibiotic agents status: Secondary | ICD-10-CM | POA: Insufficient documentation

## 2015-01-07 DIAGNOSIS — R079 Chest pain, unspecified: Secondary | ICD-10-CM | POA: Diagnosis present

## 2015-01-07 DIAGNOSIS — Z90710 Acquired absence of both cervix and uterus: Secondary | ICD-10-CM | POA: Insufficient documentation

## 2015-01-07 DIAGNOSIS — Z91013 Allergy to seafood: Secondary | ICD-10-CM | POA: Diagnosis not present

## 2015-01-07 DIAGNOSIS — Z91041 Radiographic dye allergy status: Secondary | ICD-10-CM | POA: Insufficient documentation

## 2015-01-07 DIAGNOSIS — Z7951 Long term (current) use of inhaled steroids: Secondary | ICD-10-CM | POA: Diagnosis not present

## 2015-01-07 DIAGNOSIS — Z91040 Latex allergy status: Secondary | ICD-10-CM | POA: Insufficient documentation

## 2015-01-07 DIAGNOSIS — E785 Hyperlipidemia, unspecified: Secondary | ICD-10-CM | POA: Diagnosis not present

## 2015-01-07 DIAGNOSIS — I209 Angina pectoris, unspecified: Secondary | ICD-10-CM | POA: Insufficient documentation

## 2015-01-07 HISTORY — PX: CARDIAC CATHETERIZATION: SHX172

## 2015-01-07 SURGERY — LEFT HEART CATH AND CORONARY ANGIOGRAPHY
Anesthesia: Moderate Sedation

## 2015-01-07 MED ORDER — METHYLPREDNISOLONE SODIUM SUCC 125 MG IJ SOLR
125.0000 mg | Freq: Once | INTRAMUSCULAR | Status: AC
  Administered 2015-01-07: 125 mg via INTRAVENOUS

## 2015-01-07 MED ORDER — IOHEXOL 300 MG/ML  SOLN
INTRAMUSCULAR | Status: DC | PRN
  Administered 2015-01-07: 110 mL via INTRA_ARTERIAL

## 2015-01-07 MED ORDER — SODIUM CHLORIDE 0.9 % IJ SOLN: 3.0000 mL | Freq: Two times a day (BID) | INTRAMUSCULAR | Status: DC

## 2015-01-07 MED ORDER — SODIUM CHLORIDE 0.9 % IV SOLN
INTRAVENOUS | Status: DC
  Administered 2015-01-07: 08:00:00 via INTRAVENOUS

## 2015-01-07 MED ORDER — ACETAMINOPHEN 325 MG PO TABS: 650.0000 mg | ORAL_TABLET | ORAL | Status: DC | PRN

## 2015-01-07 MED ORDER — ONDANSETRON HCL 4 MG/2ML IJ SOLN: 4.0000 mg | Freq: Four times a day (QID) | INTRAMUSCULAR | Status: DC | PRN

## 2015-01-07 MED ORDER — FENTANYL CITRATE (PF) 100 MCG/2ML IJ SOLN
INTRAMUSCULAR | Status: AC
  Filled 2015-01-07: qty 2

## 2015-01-07 MED ORDER — SODIUM CHLORIDE 0.9 % IV SOLN: 250.0000 mL | INTRAVENOUS | Status: DC | PRN

## 2015-01-07 MED ORDER — FAMOTIDINE 20 MG PO TABS: 10.0000 mg | ORAL_TABLET | Freq: Every day | ORAL | Status: DC

## 2015-01-07 MED ORDER — SODIUM CHLORIDE 0.9 % WEIGHT BASED INFUSION: 3.0000 mL/kg/h | INTRAVENOUS | Status: DC

## 2015-01-07 MED ORDER — METHYLPREDNISOLONE SODIUM SUCC 125 MG IJ SOLR
INTRAMUSCULAR | Status: AC
  Filled 2015-01-07: qty 2

## 2015-01-07 MED ORDER — FENTANYL CITRATE (PF) 100 MCG/2ML IJ SOLN
INTRAMUSCULAR | Status: DC | PRN
  Administered 2015-01-07: 25 ug via INTRAVENOUS

## 2015-01-07 MED ORDER — MIDAZOLAM HCL 2 MG/2ML IJ SOLN
INTRAMUSCULAR | Status: DC | PRN
  Administered 2015-01-07: 1 mg via INTRAVENOUS

## 2015-01-07 MED ORDER — MIDAZOLAM HCL 2 MG/2ML IJ SOLN
INTRAMUSCULAR | Status: AC
  Filled 2015-01-07: qty 2

## 2015-01-07 MED ORDER — SODIUM CHLORIDE 0.9 % IJ SOLN: 3.0000 mL | INTRAMUSCULAR | Status: DC | PRN

## 2015-01-07 MED ORDER — DIPHENHYDRAMINE HCL 50 MG/ML IJ SOLN: 25.0000 mg | Freq: Once | INTRAMUSCULAR | Status: DC

## 2015-01-07 SURGICAL SUPPLY — 9 items
CATH INFINITI 5FR ANG PIGTAIL (CATHETERS) ×3 IMPLANT
CATH INFINITI 5FR JL4 (CATHETERS) ×3 IMPLANT
CATH INFINITI JR4 5F (CATHETERS) ×3 IMPLANT
DEVICE CLOSURE MYNXGRIP 5F (Vascular Products) ×3 IMPLANT
KIT MANI 3VAL PERCEP (MISCELLANEOUS) ×3 IMPLANT
NEEDLE PERC 18GX7CM (NEEDLE) ×3 IMPLANT
PACK CARDIAC CATH (CUSTOM PROCEDURE TRAY) ×3 IMPLANT
SHEATH PINNACLE 5F 10CM (SHEATH) ×3 IMPLANT
WIRE EMERALD 3MM-J .035X150CM (WIRE) ×3 IMPLANT

## 2015-01-07 NOTE — Discharge Instructions (Signed)

## 2015-01-09 NOTE — Assessment & Plan Note (Signed)
Increase beta-blocker from 25 mg daily to 50 mg daily; she will be seeing cardiologist later today

## 2015-01-09 NOTE — Assessment & Plan Note (Signed)
Patient will be seeing neurologist soon; carotid scan reviewed with her; no urgent need for vascular consult based on test results; she is now on 325 mg aspirin instead of just 81 mg aspirin

## 2015-01-20 ENCOUNTER — Telehealth: Payer: Self-pay | Admitting: Family Medicine

## 2015-01-20 NOTE — Telephone Encounter (Signed)
Left message to call.

## 2015-01-20 NOTE — Telephone Encounter (Signed)
Patient needs to talk to Amy regarding a prescription, please call her back, thanks.

## 2015-01-21 NOTE — Telephone Encounter (Signed)
Patient states pharmacy didn't get the rx for the Metoprolol. I informed patient I had called in the rx to the yesterday.

## 2015-02-09 ENCOUNTER — Encounter: Payer: Self-pay | Admitting: Family Medicine

## 2015-02-09 ENCOUNTER — Ambulatory Visit (INDEPENDENT_AMBULATORY_CARE_PROVIDER_SITE_OTHER): Payer: BLUE CROSS/BLUE SHIELD | Admitting: Family Medicine

## 2015-02-09 VITALS — BP 141/93 | HR 91 | Temp 98.7°F | Wt 155.0 lb

## 2015-02-09 DIAGNOSIS — E7800 Pure hypercholesterolemia, unspecified: Secondary | ICD-10-CM

## 2015-02-09 DIAGNOSIS — R208 Other disturbances of skin sensation: Secondary | ICD-10-CM

## 2015-02-09 DIAGNOSIS — J0130 Acute sphenoidal sinusitis, unspecified: Secondary | ICD-10-CM | POA: Diagnosis not present

## 2015-02-09 DIAGNOSIS — R200 Anesthesia of skin: Secondary | ICD-10-CM

## 2015-02-09 DIAGNOSIS — R202 Paresthesia of skin: Secondary | ICD-10-CM | POA: Diagnosis not present

## 2015-02-09 DIAGNOSIS — L906 Striae atrophicae: Secondary | ICD-10-CM

## 2015-02-09 DIAGNOSIS — Z5181 Encounter for therapeutic drug level monitoring: Secondary | ICD-10-CM | POA: Diagnosis not present

## 2015-02-09 DIAGNOSIS — R635 Abnormal weight gain: Secondary | ICD-10-CM

## 2015-02-09 DIAGNOSIS — I10 Essential (primary) hypertension: Secondary | ICD-10-CM

## 2015-02-09 DIAGNOSIS — R718 Other abnormality of red blood cells: Secondary | ICD-10-CM | POA: Diagnosis not present

## 2015-02-09 DIAGNOSIS — R7301 Impaired fasting glucose: Secondary | ICD-10-CM | POA: Diagnosis not present

## 2015-02-09 LAB — CBC WITH DIFFERENTIAL/PLATELET
HEMATOCRIT: 45.4 % (ref 34.0–46.6)
Hemoglobin: 14.5 g/dL (ref 11.1–15.9)
LYMPHS ABS: 3.3 10*3/uL — AB (ref 0.7–3.1)
LYMPHS: 35 %
MCH: 28.3 pg (ref 26.6–33.0)
MCHC: 31.9 g/dL (ref 31.5–35.7)
MCV: 89 fL (ref 79–97)
MID (ABSOLUTE): 1.3 10*3/uL (ref 0.1–1.6)
MID: 14 %
Neutrophils Absolute: 4.7 10*3/uL (ref 1.4–7.0)
Neutrophils: 51 %
Platelets: 247 10*3/uL (ref 150–379)
RBC: 5.13 x10E6/uL (ref 3.77–5.28)
RDW: 14.9 % (ref 12.3–15.4)
WBC: 9.3 10*3/uL (ref 3.4–10.8)

## 2015-02-09 MED ORDER — METOPROLOL SUCCINATE ER 25 MG PO TB24: 50.0000 mg | ORAL_TABLET | Freq: Every day | ORAL | Status: DC

## 2015-02-09 NOTE — Progress Notes (Signed)
BP 141/93 mmHg  Pulse 91  Temp(Src) 98.7 F (37.1 C)  Wt 155 lb (70.308 kg)  SpO2 97%   Subjective:    Patient ID: Kylie [name], female    DOB: 1955/09/04, 60 y.o.   MRN: [id number]  HPI: Kylie [name] is a 60 y.o. female  Chief Complaint  Patient presents with  . Follow-up    6 week follow up; she went to neuro and they checked her Vit D, it was 12.3 and her glucose was 134 and A1c was 6.3. She is very worried.    She saw Dr. Juliann Pares, cardiologist, since last visit She was told she has a click in her heart but then they did the echocardiogram and couldn't find anything They could not find the actual echo though, and I cannot find it either when reviewing EPIC  Her vitamin D was really low at the neurologist's office and they put on Rx vitamin D Glucose 134 and A1c was 6.3; that was January 11th She has cut way back on sugar; eats some sugar sometimes, but thinking about strict diet; consider vegetarian or vegan diet Gaining weight even though not eating a whole lot; drinks a lot of water Trouble losing weight Increase in belly fat; thin extremities; has hump on her upper back, burning sensation over the upper back; for two years; hump started to grow over the last two years; that runs in the family No diabetes in the family Positive thyroid disease in mother Her face is looking rounder; getting stretch marks on abdomen and breasts She has never weighed this much in her whole life; weight going up rapidly No increased hair; having some acne, that's new She goes to bed around 10 pm; wakes up around 6 am   Relevant past medical, surgical, family and social history reviewed and updated as indicated. Interim medical history since our last visit reviewed. Allergies and medications reviewed and updated.  Review of Systems Per HPI unless specifically indicated above     Objective:    BP 141/93 mmHg  Pulse 91  Temp(Src) 98.7 F (37.1 C)  Wt 155 lb (70.308 kg)  SpO2 97%   Wt Readings from Last 3 Encounters:  02/09/15 155 lb (70.308 kg)  01/07/15 152 lb (68.947 kg)  01/06/15 152 lb (68.947 kg)   body mass index is 32.4 kg/(m^2).  Physical Exam  Constitutional:  Weight gain 3 more pounds; truncal obesity and thin extremities noted  HENT:  Mouth/Throat: Mucous membranes are not dry.  Eyes: EOM are normal. No scleral icterus.  Neck: No thyromegaly present.  Cardiovascular: Normal rate and regular rhythm.   No extrasystoles are present.  No murmur heard. No click  Pulmonary/Chest: Effort normal and breath sounds normal. She has no decreased breath sounds. She has no wheezes.  Abdominal: She exhibits no distension.  obese  Musculoskeletal: She exhibits no edema.       Cervical back: She exhibits swelling (slight hump over upper back/lower neck).  Neurological: She displays no tremor. No cranial nerve deficit or sensory deficit. She exhibits normal muscle tone.  Reflex Scores:      Patellar reflexes are 1+ on the right side and 1+ on the left side. Skin: No rash noted. She is not diaphoretic. No pallor.  I did not see any striae on upper breasts  Psychiatric: She has a normal mood and affect. Her speech is normal. Her mood appears not anxious. Cognition and memory are normal. She does not exhibit a depressed  mood.   Results for orders placed or performed in visit on 02/09/15  CBC With Differential/Platelet (STAT)  Result Value Ref Range   WBC 9.3 3.4 - 10.8 x10E3/uL   RBC 5.13 3.77 - 5.28 x10E6/uL   Hemoglobin 14.5 11.1 - 15.9 g/dL   Hematocrit 82.9 56.2 - 46.6 %   MCV 89 79 - 97 fL   MCH 28.3 26.6 - 33.0 pg   MCHC 31.9 31.5 - 35.7 g/dL   RDW 13.0 86.5 - 78.4 %   Platelets 247 150 - 379 x10E3/uL   Neutrophils 51 %   Lymphs 35 %   MID 14 %   Neutrophils Absolute 4.7 1.4 - 7.0 x10E3/uL   Lymphocytes Absolute 3.3 (H) 0.7 - 3.1 x10E3/uL   MID (Absolute) 1.3 0.1 - 1.6 X10E3/uL   In-house CBC WBC 9.3 RBC 5.13 Hgb 14.5 Hct 45.4 Plt 247      Assessment & Plan:   Problem List Items Addressed This Visit      Cardiovascular and Mediastinum   Essential hypertension, benign    Work on weight loss, DASH guidelines      Relevant Medications   metoprolol succinate (TOPROL XL) 25 MG 24 hr tablet     Respiratory   RESOLVED: Acute sphenoidal sinusitis    Lab -- this was entered as the reason for the CBC and ALT, but is NOT the correct diagnosis code; she was not seen for this problem today and none of her labs should be attributed to this      Relevant Orders   CBC With Differential/Platelet (STAT) (Completed)     Endocrine   IFG (impaired fasting glucose)    A1c was 6.3 earlier this month at another office; will be due for recheck on or after July 21, 2015; work on reducing sweets, losing weight, being active      Relevant Orders   Cortisol     Musculoskeletal and Integument   Striae    Reported by patient, some purple in color before fading; will check cortisol level, especially with her rising glucose, abdominal adiposity, rounding of face, etc.      Relevant Orders   Cortisol     Other   Hypercholesteremia - Primary    Check lipid panel today; new LDL goal is less than 70      Relevant Medications   metoprolol succinate (TOPROL XL) 25 MG 24 hr tablet   Other Relevant Orders   Lipid Panel w/o Chol/HDL Ratio   Right arm numbness    Seen by neurologist; appreciate their evaluation      Right leg numbness    Seen by neurologist; appreciate their evaluation      Elevated red blood cell count    Recheck CBC today; last RBC was elevated, which can be seen with OSA; she has a sleep study pending      Weight gain, abnormal    Predominantly truncal obesity; last TSH reviewed, normal in Sept; will check cortisol tomorrow morning; if weight gain persists, consider repeat TSH and/or referral to endocrinologist       Other Visit Diagnoses    Medication monitoring encounter        Relevant Orders    ALT     Basic metabolic panel        Follow up plan: Return in about 3 months (around 05/09/2015) for thirty minute follow-up with fasting labs.  Orders Placed This Encounter  Procedures  . CBC With Differential/Platelet (STAT)  .  Cortisol  . Lipid Panel w/o Chol/HDL Ratio  . ALT  . Basic metabolic panel   An after-visit summary was printed and given to the patient at check-out.  Please see the patient instructions which may contain other information and recommendations beyond what is mentioned above in the assessment and plan.

## 2015-02-09 NOTE — Assessment & Plan Note (Signed)
A1c was 6.3 earlier this month at another office; will be due for recheck on or after July 21, 2015; work on reducing sweets, losing weight, being active

## 2015-02-09 NOTE — Assessment & Plan Note (Addendum)
Check lipid panel today; new LDL goal is less than 70

## 2015-02-09 NOTE — Assessment & Plan Note (Signed)
Recheck CBC today; last RBC was elevated, which can be seen with OSA; she has a sleep study pending

## 2015-02-09 NOTE — Assessment & Plan Note (Signed)
Predominantly truncal obesity; last TSH reviewed, normal in Sept; will check cortisol tomorrow morning; if weight gain persists, consider repeat TSH and/or referral to endocrinologist

## 2015-02-09 NOTE — Assessment & Plan Note (Signed)
Seen by neurologist; appreciate their evaluation 

## 2015-02-09 NOTE — Assessment & Plan Note (Addendum)
Work on weight loss, DASH guidelines 

## 2015-02-09 NOTE — Assessment & Plan Note (Signed)
Seen by neurologist; appreciate their evaluation

## 2015-02-09 NOTE — Patient Instructions (Signed)
Return tomorrow for fasting labs first thing of the morning Okay to drink water or black coffee or diet drink Your goal blood pressure is less than 140 mmHg on top. Try to follow the DASH guidelines (DASH stands for Dietary Approaches to Stop Hypertension) Try to limit the sodium in your diet.  Ideally, consume less than 1.5 grams (less than 1,500mg ) per day. Do not add salt when cooking or at the table.  Check the sodium amount on labels when shopping, and choose items lower in sodium when given a choice. Avoid or limit foods that already contain a lot of sodium. Eat a diet rich in fruits and vegetables and whole grains. Try to limit saturated fats in your diet (bologna, hot dogs, barbeque, cheeseburgers, hamburgers, steak, bacon, sausage, cheese, etc.) and get more fresh fruits, vegetables, and whole grains

## 2015-02-09 NOTE — Assessment & Plan Note (Addendum)
Lab -- this was entered as the reason for the CBC and ALT, but is NOT the correct diagnosis code; she was not seen for this problem today and none of her labs should be attributed to this

## 2015-02-09 NOTE — Assessment & Plan Note (Signed)
Reported by patient, some purple in color before fading; will check cortisol level, especially with her rising glucose, abdominal adiposity, rounding of face, etc.

## 2015-02-10 ENCOUNTER — Other Ambulatory Visit: Payer: BLUE CROSS/BLUE SHIELD

## 2015-02-10 DIAGNOSIS — L906 Striae atrophicae: Secondary | ICD-10-CM

## 2015-02-10 DIAGNOSIS — R7301 Impaired fasting glucose: Secondary | ICD-10-CM

## 2015-02-10 LAB — LIPID PANEL W/O CHOL/HDL RATIO
CHOLESTEROL TOTAL: 178 mg/dL (ref 100–199)
HDL: 50 mg/dL (ref >39)
LDL Calculated: 106 mg/dL — ABNORMAL HIGH (ref 0–99)
Triglycerides: 112 mg/dL (ref 0–149)
VLDL CHOLESTEROL CAL: 22 mg/dL (ref 5–40)

## 2015-02-10 LAB — BASIC METABOLIC PANEL
BUN / CREAT RATIO: 16 (ref 9–23)
BUN: 17 mg/dL (ref 6–24)
CO2: 26 mmol/L (ref 18–29)
CREATININE: 1.04 mg/dL — AB (ref 0.57–1.00)
Calcium: 9.8 mg/dL (ref 8.7–10.2)
Chloride: 100 mmol/L (ref 96–106)
GFR calc Af Amer: 69 mL/min/{1.73_m2} (ref >59)
GFR, EST NON AFRICAN AMERICAN: 60 mL/min/{1.73_m2} (ref >59)
GLUCOSE: 89 mg/dL (ref 65–99)
Potassium: 4.7 mmol/L (ref 3.5–5.2)
SODIUM: 143 mmol/L (ref 134–144)

## 2015-02-10 LAB — ALT: ALT: 19 IU/L (ref 0–32)

## 2015-02-11 LAB — CORTISOL: CORTISOL: 10.5 ug/dL

## 2015-03-08 ENCOUNTER — Other Ambulatory Visit: Payer: Self-pay

## 2015-03-08 MED ORDER — SIMVASTATIN 10 MG PO TABS: 10.0000 mg | ORAL_TABLET | Freq: Every day | ORAL | Status: DC

## 2015-03-08 NOTE — Telephone Encounter (Signed)
Routing to provider; Dr. Sherie Don patient.

## 2015-03-19 ENCOUNTER — Other Ambulatory Visit: Payer: Self-pay

## 2015-03-19 MED ORDER — METOPROLOL SUCCINATE ER 25 MG PO TB24: 50.0000 mg | ORAL_TABLET | Freq: Every day | ORAL | Status: DC

## 2015-03-19 NOTE — Telephone Encounter (Signed)
Routing to provider for refill

## 2015-04-05 ENCOUNTER — Other Ambulatory Visit: Payer: Self-pay

## 2015-04-05 MED ORDER — METOPROLOL SUCCINATE ER 50 MG PO TB24: 50.0000 mg | ORAL_TABLET | Freq: Every day | ORAL | Status: DC

## 2015-04-05 NOTE — Telephone Encounter (Signed)
New rx sent with messages on sig and to pharmacist, I'm changing from two of the 25 mg pills a single 50 mg pill

## 2015-04-05 NOTE — Telephone Encounter (Signed)
Routing to provider. She knows request is early, but she wants rx to be there when its due to be refilled.

## 2015-05-11 ENCOUNTER — Ambulatory Visit: Payer: BLUE CROSS/BLUE SHIELD | Admitting: Family Medicine

## 2015-05-20 ENCOUNTER — Ambulatory Visit (INDEPENDENT_AMBULATORY_CARE_PROVIDER_SITE_OTHER): Payer: BLUE CROSS/BLUE SHIELD | Admitting: Family Medicine

## 2015-05-20 ENCOUNTER — Encounter: Payer: Self-pay | Admitting: Family Medicine

## 2015-05-20 VITALS — BP 126/78 | HR 97 | Temp 97.6°F | Resp 16 | Wt 161.0 lb

## 2015-05-20 DIAGNOSIS — R635 Abnormal weight gain: Secondary | ICD-10-CM | POA: Insufficient documentation

## 2015-05-20 DIAGNOSIS — R7301 Impaired fasting glucose: Secondary | ICD-10-CM | POA: Diagnosis not present

## 2015-05-20 DIAGNOSIS — R5383 Other fatigue: Secondary | ICD-10-CM | POA: Diagnosis not present

## 2015-05-20 DIAGNOSIS — E559 Vitamin D deficiency, unspecified: Secondary | ICD-10-CM

## 2015-05-20 DIAGNOSIS — Z1239 Encounter for other screening for malignant neoplasm of breast: Secondary | ICD-10-CM | POA: Insufficient documentation

## 2015-05-20 DIAGNOSIS — E7800 Pure hypercholesterolemia, unspecified: Secondary | ICD-10-CM | POA: Diagnosis not present

## 2015-05-20 MED ORDER — ALBUTEROL SULFATE HFA 108 (90 BASE) MCG/ACT IN AERS: 2.0000 | INHALATION_SPRAY | RESPIRATORY_TRACT | Status: DC | PRN

## 2015-05-20 NOTE — Assessment & Plan Note (Signed)
Check A1c and glucose, fasting

## 2015-05-20 NOTE — Progress Notes (Signed)
BP 126/78 mmHg  Pulse 97  Temp(Src) 97.6 F (36.4 C) (Oral)  Resp 16  Wt 161 lb (73.029 kg)  SpO2 92%   Subjective:    Patient ID: Kylie [name], female    DOB: March 25, 1955, 60 y.o.   MRN: [id number]  HPI: Kylie [name] is a 60 y.o. female  Chief Complaint  Patient presents with  . Follow-up  . Obesity    patient states keeps gaining weight even though she does not eat anything   She is gaining weight; affecting her health; eats honey wheat bars for breakfast; little bit of chicken pie for lunch, little dinner; small wrists; putting weight on her abdomen; 9 pounds of weight gain since Christmas; no one in the family has thyroid issues to her knowledge; she is developing a hump on back, belly weight gain; dry mouth; no new stretch marks except where is gaining weight; problems getting out of bed in the morning; trouble breathing, keeps gas in her abdomen; had hiatal hernia; bowels are moving fine; lots of fatigue, too tired to do anything; husband has not seen this tired in 33 years; constantly moving and walking in her home, walks the dog, 3000 sq ft home, active but can't exercise; no sugary drinks, little bit of sprite once in a while, mostly water; no iced tea  Did fall the other day, broke her fall with her elbows; dog took her down; scrapes on her arms; mechanical fall; no seizure activity or cardiac issues reported  Vit D deficiency; took the Rx vit D but did not pick up OTC afterwards; tired  High cholesterol; staying away from fatty meats; xanthelasma getting better on the medicine; sister is getting cholesterol spots  Depression screen [facility] 2/9 06/03/2015 05/20/2015 05/20/2015  Decreased Interest 0 0 0  Down, Depressed, Hopeless 0 0 0  PHQ - 2 Score 0 0 0   Relevant past medical, surgical, family and social history reviewed Past Medical History  Diagnosis Date  . Diverticulitis     hospitalized several times  . Hyperlipidemia   . GERD (gastroesophageal reflux disease)    . Hiatal hernia   . Asthma   . Murmur   . CKD (chronic kidney disease) stage 3, GFR 30-59 ml/min   . Hypercholesteremia   . Heart murmur   . Leaky heart valve   . Hypertension   . Anemia   . Anxiety     followed by Dr. Janeece Riggers  . Depression     followed by Dr. Janeece Riggers  . Panic attacks     followed by Dr. Janeece Riggers  . Overactive bladder   . AVM (arteriovenous malformation) March 2016    distal left thigh  . Low serum cortisol level (HCC) 06/03/2015  . Chronic kidney disease, stage II (mild)    Past Surgical History  Procedure Laterality Date  . Colonoscopy  2013    Dr Wandalee Ferdinand at [facility]  . Upper gi endoscopy  2013    Dr Wandalee Ferdinand at [facility]  . Sigmoid resection / rectopexy  10/17/13  . Appendectomy  2006  . Tubal ligation  1981  . Abdominal hysterectomy  2007    Total with BSO  . Cardiac catheterization N/A 01/07/2015    Procedure: Left Heart Cath and Coronary Angiography;  Surgeon: Lamar Blinks, MD;  Location: [facility] INVASIVE CV LAB;  Service: Cardiovascular;  Laterality: N/A;   Family History  Problem Relation Age of Onset  . Cancer Mother  colon  . Kidney disease Mother   . Arthritis Father   . Alcohol abuse Father   . Mental illness Father   . Diverticulitis Father   . Asthma Sister   . Hyperlipidemia Sister   . Hypertension Sister   . Migraines Sister   . Heart disease Brother   . Hyperlipidemia Brother   . Hypertension Brother    Social History  Substance Use Topics  . Smoking status: Former Smoker -- 2.00 packs/day for 25 years    Types: Cigarettes    Quit date: 01/10/2011  . Smokeless tobacco: Never Used  . Alcohol Use: No  quit smoking cold [place]  Interim medical history since last visit reviewed. Allergies and medications reviewed  Review of Systems Per HPI unless specifically indicated above; occasional headaches     Objective:    BP 126/78 mmHg  Pulse 97  Temp(Src) 97.6 F (36.4 C) (Oral)  Resp 16  Wt 161 lb (73.029 kg)  SpO2 92%  Wt  Readings from Last 3 Encounters:  06/03/15 164 lb 6.4 oz (74.571 kg)  05/20/15 161 lb (73.029 kg)  02/09/15 155 lb (70.308 kg)    Physical Exam  Constitutional: She appears well-developed and well-nourished. No distress.  Truncal obesity  HENT:  Head: Normocephalic and atraumatic.  Eyes: EOM are normal. No scleral icterus.  Neck: No JVD present. No thyroid mass and no thyromegaly present.  Cardiovascular: Normal rate, regular rhythm and normal heart sounds.   No murmur heard. Pulmonary/Chest: Effort normal and breath sounds normal. No respiratory distress. She has no wheezes.  Abdominal: Soft. Bowel sounds are normal. She exhibits no distension.  Musculoskeletal: Normal range of motion. She exhibits no edema.  Neurological: She is alert. She displays no tremor. She exhibits normal muscle tone.  Skin: Skin is warm and dry. She is not diaphoretic. No pallor.  Psychiatric: She has a normal mood and affect. Her behavior is normal. Judgment and thought content normal.    Results for orders placed or performed in visit on 02/10/15  Cortisol  Result Value Ref Range   Cortisol 10.5 ug/dL      Assessment & Plan:   Problem List Items Addressed This Visit      Endocrine   IFG (impaired fasting glucose)    Check A1c and glucose, fasting      Relevant Orders   Hemoglobin A1c     Other   Abnormal weight gain - Primary   Relevant Orders   T4, free   TSH   Breast cancer screening   Relevant Orders   MM DIGITAL SCREENING BILATERAL   Hypercholesteremia    Not eating excessive saturated fats; high chol runs in the family; check lipids      Relevant Orders   Lipid Panel w/o Chol/HDL Ratio   Vitamin D deficiency   Relevant Orders   VITAMIN D 25 Hydroxy (Vit-D Deficiency, Fractures)    Other Visit Diagnoses    Other fatigue        Relevant Orders    CBC with Differential/Platelet    Comprehensive metabolic panel    Vitamin B12    Cortisol       Follow up plan: Return in  about 2 weeks (around 06/03/2015).  An after-visit summary was printed and given to the patient at check-out.  Please see the patient instructions which may contain other information and recommendations beyond what is mentioned above in the assessment and plan.  Meds ordered this encounter  Medications  .  albuterol (PROVENTIL HFA;VENTOLIN HFA) 108 (90 Base) MCG/ACT inhaler    Sig: Inhale 2 puffs into the lungs every 4 (four) hours as needed for wheezing or shortness of breath.    Dispense:  1 Inhaler    Refill:  1    Orders Placed This Encounter  Procedures  . MM DIGITAL SCREENING BILATERAL  . Hemoglobin A1c  . CBC with Differential/Platelet  . T4, free  . TSH  . Lipid Panel w/o Chol/HDL Ratio  . Comprehensive metabolic panel  . VITAMIN D 25 Hydroxy (Vit-D Deficiency, Fractures)  . Vitamin B12  . Cortisol

## 2015-05-20 NOTE — Patient Instructions (Addendum)
Let's have you get first thing in the morning fasting labs tomorrow and see what those show Try to limit saturated fats in your diet (bologna, hot dogs, barbeque, cheeseburgers, hamburgers, steak, bacon, sausage, cheese, etc.) and get more fresh fruits, vegetables, and whole grains

## 2015-05-20 NOTE — Assessment & Plan Note (Signed)
Not eating excessive saturated fats; high chol runs in the family; check lipids

## 2015-05-27 ENCOUNTER — Other Ambulatory Visit
Admission: RE | Admit: 2015-05-27 | Discharge: 2015-05-27 | Disposition: A | Discharge status: Home / Self Care | Payer: BLUE CROSS/BLUE SHIELD | Source: Ambulatory Visit | Attending: Family Medicine | Admitting: Family Medicine

## 2015-05-27 DIAGNOSIS — R635 Abnormal weight gain: Secondary | ICD-10-CM | POA: Insufficient documentation

## 2015-05-27 LAB — COMPREHENSIVE METABOLIC PANEL
ALT: 18 U/L (ref 14–54)
ANION GAP: 5 (ref 5–15)
AST: 15 U/L (ref 15–41)
Albumin: 3.6 g/dL (ref 3.5–5.0)
Alkaline Phosphatase: 68 U/L (ref 38–126)
BUN: 14 mg/dL (ref 6–20)
CALCIUM: 9.1 mg/dL (ref 8.9–10.3)
CHLORIDE: 105 mmol/L (ref 101–111)
CO2: 32 mmol/L (ref 22–32)
CREATININE: 1 mg/dL (ref 0.44–1.00)
Glucose, Bld: 97 mg/dL (ref 65–99)
Potassium: 4.3 mmol/L (ref 3.5–5.1)
Sodium: 142 mmol/L (ref 135–145)
Total Bilirubin: 0.4 mg/dL (ref 0.3–1.2)
Total Protein: 7.7 g/dL (ref 6.5–8.1)

## 2015-05-27 LAB — CBC WITH DIFFERENTIAL/PLATELET
Basophils Absolute: 0.1 10*3/uL (ref 0–0.1)
Basophils Relative: 1 %
Eosinophils Absolute: 0.3 10*3/uL (ref 0–0.7)
Eosinophils Relative: 3 %
HEMATOCRIT: 42.2 % (ref 35.0–47.0)
Hemoglobin: 13.7 g/dL (ref 12.0–16.0)
LYMPHS ABS: 2.3 10*3/uL (ref 1.0–3.6)
MCH: 27.4 pg (ref 26.0–34.0)
MCHC: 32.5 g/dL (ref 32.0–36.0)
MCV: 84.4 fL (ref 80.0–100.0)
MONO ABS: 0.7 10*3/uL (ref 0.2–0.9)
NEUTROS ABS: 6.1 10*3/uL (ref 1.4–6.5)
Neutrophils Relative %: 64 %
Platelets: 253 10*3/uL (ref 150–440)
RBC: 5 MIL/uL (ref 3.80–5.20)
RDW: 14.5 % (ref 11.5–14.5)
WBC: 9.4 10*3/uL (ref 3.6–11.0)

## 2015-05-27 LAB — LIPID PANEL
CHOL/HDL RATIO: 4 ratio
Cholesterol: 153 mg/dL (ref 0–200)
HDL: 38 mg/dL — ABNORMAL LOW (ref >40)
LDL CALC: 95 mg/dL (ref 0–99)
Triglycerides: 100 mg/dL (ref <150)
VLDL: 20 mg/dL (ref 0–40)

## 2015-05-27 LAB — T4, FREE: Free T4: 0.89 ng/dL (ref 0.61–1.12)

## 2015-05-27 LAB — VITAMIN B12: VITAMIN B 12: 383 pg/mL (ref 180–914)

## 2015-05-27 LAB — HEMOGLOBIN A1C: Hgb A1c MFr Bld: 6.2 % — ABNORMAL HIGH (ref 4.0–6.0)

## 2015-05-27 LAB — CORTISOL: CORTISOL PLASMA: 5.7 ug/dL

## 2015-05-27 LAB — TSH: TSH: 1.38 u[IU]/mL (ref 0.350–4.500)

## 2015-05-28 LAB — VITAMIN D 25 HYDROXY (VIT D DEFICIENCY, FRACTURES): VIT D 25 HYDROXY: 22.4 ng/mL — AB (ref 30.0–100.0)

## 2015-06-03 ENCOUNTER — Ambulatory Visit (INDEPENDENT_AMBULATORY_CARE_PROVIDER_SITE_OTHER): Payer: BLUE CROSS/BLUE SHIELD | Admitting: Family Medicine

## 2015-06-03 ENCOUNTER — Encounter: Payer: Self-pay | Admitting: Family Medicine

## 2015-06-03 VITALS — BP 120/70 | HR 100 | Temp 97.8°F | Resp 16 | Ht <= 58 in | Wt 164.4 lb

## 2015-06-03 DIAGNOSIS — E2740 Unspecified adrenocortical insufficiency: Secondary | ICD-10-CM | POA: Diagnosis not present

## 2015-06-03 DIAGNOSIS — R635 Abnormal weight gain: Secondary | ICD-10-CM

## 2015-06-03 DIAGNOSIS — E559 Vitamin D deficiency, unspecified: Secondary | ICD-10-CM | POA: Diagnosis not present

## 2015-06-03 DIAGNOSIS — E538 Deficiency of other specified B group vitamins: Secondary | ICD-10-CM | POA: Diagnosis not present

## 2015-06-03 DIAGNOSIS — H0260 Xanthelasma of unspecified eye, unspecified eyelid: Secondary | ICD-10-CM

## 2015-06-03 DIAGNOSIS — R7989 Other specified abnormal findings of blood chemistry: Secondary | ICD-10-CM

## 2015-06-03 DIAGNOSIS — N182 Chronic kidney disease, stage 2 (mild): Secondary | ICD-10-CM | POA: Diagnosis not present

## 2015-06-03 DIAGNOSIS — R7301 Impaired fasting glucose: Secondary | ICD-10-CM | POA: Diagnosis not present

## 2015-06-03 DIAGNOSIS — E785 Hyperlipidemia, unspecified: Secondary | ICD-10-CM | POA: Insufficient documentation

## 2015-06-03 DIAGNOSIS — R718 Other abnormality of red blood cells: Secondary | ICD-10-CM | POA: Diagnosis not present

## 2015-06-03 HISTORY — DX: Other specified abnormal findings of blood chemistry: R79.89

## 2015-06-03 HISTORY — DX: Unspecified adrenocortical insufficiency: E27.40

## 2015-06-03 MED ORDER — SIMVASTATIN 20 MG PO TABS: 20.0000 mg | ORAL_TABLET | Freq: Every day | ORAL | Status: DC

## 2015-06-03 MED ORDER — B-12 1000 MCG SL SUBL: 1.0000 | SUBLINGUAL_TABLET | Freq: Every day | SUBLINGUAL | Status: DC

## 2015-06-03 MED ORDER — VITAMIN D (ERGOCALCIFEROL) 1.25 MG (50000 UNIT) PO CAPS: 50000.0000 [IU] | ORAL_CAPSULE | ORAL | Status: DC

## 2015-06-03 NOTE — Assessment & Plan Note (Signed)
Well-controlled; weight loss would help prevent frank diabetes down the road

## 2015-06-03 NOTE — Assessment & Plan Note (Signed)
Resolved on most recent lab work, reviewed wit patient

## 2015-06-03 NOTE — Assessment & Plan Note (Signed)
Patient believes this is improving with statin therapy

## 2015-06-03 NOTE — Progress Notes (Signed)
BP 120/70 mmHg  Pulse 100  Temp(Src) 97.8 F (36.6 C) (Oral)  Resp 16  Ht  (1.473 m)  Wt 164 lb 6.4 oz (74.571 kg)  BMI 34.37 kg/m2  SpO2 92%   Subjective:    Patient ID: Kylie [name], female    DOB: Jul 22, 1955, 60 y.o.   MRN: [id number]  HPI: Kylie [name] is a 60 y.o. female  Chief Complaint  Patient presents with  . Follow-up    Lab work  . Weight Check   Patient is here with her sister; she is disappointed at how much weight she is gaining; she doesn't think it makes sense relative to what she is eating; she is very tired and has no energy She was here two weeks and labs were obtained on the 18th; she is here with her sister to go over all of these labs  Depression screen [facility] 2/9 06/03/2015 05/20/2015 05/20/2015  Decreased Interest 0 0 0  Down, Depressed, Hopeless 0 0 0  PHQ - 2 Score 0 0 0   Relevant past medical, surgical, family and social history reviewed Past Medical History  Diagnosis Date  . Diverticulitis     hospitalized several times  . Hyperlipidemia   . GERD (gastroesophageal reflux disease)   . Hiatal hernia   . Asthma   . Murmur   . CKD (chronic kidney disease) stage 3, GFR 30-59 ml/min   . Hypercholesteremia   . Heart murmur   . Leaky heart valve   . Hypertension   . Anemia   . Anxiety     followed by Dr. Janeece Riggers  . Depression     followed by Dr. Janeece Riggers  . Panic attacks     followed by Dr. Janeece Riggers  . Overactive bladder   . AVM (arteriovenous malformation) March 2016    distal left thigh  . Low serum cortisol level (HCC) 06/03/2015  . Chronic kidney disease, stage II (mild)    Past Surgical History  Procedure Laterality Date  . Colonoscopy  2013    Dr Wandalee Ferdinand at [facility]  . Upper gi endoscopy  2013    Dr Wandalee Ferdinand at [facility]  . Sigmoid resection / rectopexy  10/17/13  . Appendectomy  2006  . Tubal ligation  1981  . Abdominal hysterectomy  2007    Total with BSO  . Cardiac catheterization N/A 01/07/2015    Procedure: Left Heart Cath  and Coronary Angiography;  Surgeon: Lamar Blinks, MD;  Location: [facility] INVASIVE CV LAB;  Service: Cardiovascular;  Laterality: N/A;   Family History  Problem Relation Age of Onset  . Cancer Mother     colon  . Kidney disease Mother   . Arthritis Father   . Alcohol abuse Father   . Mental illness Father   . Diverticulitis Father   . Asthma Sister   . Hyperlipidemia Sister   . Hypertension Sister   . Migraines Sister   . Heart disease Brother   . Hyperlipidemia Brother   . Hypertension Brother    Social History  Substance Use Topics  . Smoking status: Former Smoker -- 2.00 packs/day for 25 years    Types: Cigarettes    Quit date: 01/10/2011  . Smokeless tobacco: Never Used  . Alcohol Use: No    Interim medical history since last visit reviewed. Allergies and medications reviewed  Review of Systems Per HPI unless specifically indicated above     Objective:    BP  120/70 mmHg  Pulse 100  Temp(Src) 97.8 F (36.6 C) (Oral)  Resp 16  Ht 4\' 10"  (1.473 m)  Wt 164 lb 6.4 oz (74.571 kg)  BMI 34.37 kg/m2  SpO2 92%  Wt Readings from Last 3 Encounters:  06/03/15 164 lb 6.4 oz (74.571 kg)  05/20/15 161 lb (73.029 kg)  02/09/15 155 lb (70.308 kg)    Physical Exam  Constitutional: She appears well-developed and well-nourished. No distress.  Weight gain 9+ pounds over less than last 4 months  Eyes: No scleral icterus.  Cardiovascular: Normal rate and regular rhythm.   Pulmonary/Chest: Effort normal and breath sounds normal.  Musculoskeletal: She exhibits no edema (no pitting edema of legs; trace sock line from elastic above ankles).  Skin: No pallor.  Psychiatric: She has a normal mood and affect. Her behavior is normal.    Results for orders placed or performed during the hospital encounter of 05/27/15  Hemoglobin A1c  Result Value Ref Range   Hgb A1c MFr Bld 6.2 (H) 4.0 - 6.0 %  CBC with Differential/Platelet  Result Value Ref Range   WBC 9.4 3.6 - 11.0 K/uL    RBC 5.00 3.80 - 5.20 MIL/uL   Hemoglobin 13.7 12.0 - 16.0 g/dL   HCT 45.4 09.8 - 11.9 %   MCV 84.4 80.0 - 100.0 fL   MCH 27.4 26.0 - 34.0 pg   MCHC 32.5 32.0 - 36.0 g/dL   RDW 14.7 82.9 - 56.2 %   Platelets 253 150 - 440 K/uL   Neutrophils Relative % 64% %   Neutro Abs 6.1 1.4 - 6.5 K/uL   Lymphocytes Relative 25% %   Lymphs Abs 2.3 1.0 - 3.6 K/uL   Monocytes Relative 7% %   Monocytes Absolute 0.7 0.2 - 0.9 K/uL   Eosinophils Relative 3% %   Eosinophils Absolute 0.3 0 - 0.7 K/uL   Basophils Relative 1% %   Basophils Absolute 0.1 0 - 0.1 K/uL  T4, free  Result Value Ref Range   Free T4 0.89 0.61 - 1.12 ng/dL  TSH  Result Value Ref Range   TSH 1.380 0.350 - 4.500 uIU/mL  Lipid panel  Result Value Ref Range   Cholesterol 153 0 - 200 mg/dL   Triglycerides 130 <865 mg/dL   HDL 38 (L) >78 mg/dL   Total CHOL/HDL Ratio 4.0 RATIO   VLDL 20 0 - 40 mg/dL   LDL Cholesterol 95 0 - 99 mg/dL  Comprehensive metabolic panel  Result Value Ref Range   Sodium 142 135 - 145 mmol/L   Potassium 4.3 3.5 - 5.1 mmol/L   Chloride 105 101 - 111 mmol/L   CO2 32 22 - 32 mmol/L   Glucose, Bld 97 65 - 99 mg/dL   BUN 14 6 - 20 mg/dL   Creatinine, Ser 4.69 0.44 - 1.00 mg/dL   Calcium 9.1 8.9 - 62.9 mg/dL   Total Protein 7.7 6.5 - 8.1 g/dL   Albumin 3.6 3.5 - 5.0 g/dL   AST 15 15 - 41 U/L   ALT 18 14 - 54 U/L   Alkaline Phosphatase 68 38 - 126 U/L   Total Bilirubin 0.4 0.3 - 1.2 mg/dL   GFR calc non Af Amer >60 >60 mL/min   GFR calc Af Amer >60 >60 mL/min   Anion gap 5 5 - 15  VITAMIN D 25 Hydroxy (Vit-D Deficiency, Fractures)  Result Value Ref Range   Vit D, 25-Hydroxy 22.4 (L) 30.0 - 100.0 ng/mL  Vitamin B12  Result Value Ref Range   Vitamin B-12 383 180 - 914 pg/mL  Cortisol  Result Value Ref Range   Cortisol, Plasma 5.7 ug/dL      Assessment & Plan:   Problem List Items Addressed This Visit      Digestive   Vitamin B12 deficiency     Endocrine   IFG (impaired fasting glucose)      Well-controlled; weight loss would help prevent frank diabetes down the road      Low serum cortisol level (HCC) - Primary    Reviewed labs; very mildly low cortisol level, truly AM; explained endo may not be excited about this number and will likely recheck and may do another test; regardless, she has excessive fatigue and I'm comfortable referring her to an endocrinologist      Relevant Orders   Ambulatory referral to Endocrinology     Musculoskeletal and Integument   Xanthelasma of eyelid    Patient believes this is improving with statin therapy        Genitourinary   Chronic kidney disease, stage II (mild)    Glad to see that her renal function shows CKD up in stage 2 range        Other   Elevated red blood cell count    Resolved on most recent lab work, reviewed wit patient      Hyperlipidemia LDL goal <70    Reviewed last 3 lipid panels with patient; goal LDL under 70; will increase the statin, recheck lipids and SGPT in 6-8 weeks; limit saturated fats      Relevant Medications   simvastatin (ZOCOR) 20 MG tablet   Vitamin D deficiency    Start back on D Rx      Weight gain, abnormal    Frustrating for patient; normal thyroid tests; she does not appear to be fluid-overloaded, and I don't suspect CHF; referring to endo         Follow up plan: Return in about 8 weeks (around 07/29/2015) for fasting labs and visit.  An after-visit summary was printed and given to the patient at check-out.  Please see the patient instructions which may contain other information and recommendations beyond what is mentioned above in the assessment and plan.  Meds ordered this encounter  Medications  . simvastatin (ZOCOR) 20 MG tablet    Sig: Take 1 tablet (20 mg total) by mouth at bedtime.    Dispense:  30 tablet    Refill:  1    New dose  . Vitamin D, Ergocalciferol, (DRISDOL) 50000 units CAPS capsule    Sig: Take 1 capsule (50,000 Units total) by mouth every 7 (seven) days.     Dispense:  4 capsule    Refill:  1  . Cyanocobalamin (B-12) 1000 MCG SUBL    Sig: Place 1 tablet under the tongue daily.    Dispense:  30 each    Refill:  11   Orders Placed This Encounter  Procedures  . Ambulatory referral to Endocrinology

## 2015-06-03 NOTE — Assessment & Plan Note (Signed)
Start back on D Rx

## 2015-06-03 NOTE — Assessment & Plan Note (Signed)
Reviewed last 3 lipid panels with patient; goal LDL under 70; will increase the statin, recheck lipids and SGPT in 6-8 weeks; limit saturated fats

## 2015-06-03 NOTE — Assessment & Plan Note (Signed)
Frustrating for patient; normal thyroid tests; she does not appear to be fluid-overloaded, and I don't suspect CHF; referring to endo

## 2015-06-03 NOTE — Assessment & Plan Note (Signed)
Glad to see that her renal function shows CKD up in stage 2 range

## 2015-06-03 NOTE — Patient Instructions (Addendum)
Please do call to schedule your mammogram; the number to schedule [facility] either [facility] or [facility] is [phone]  We'll have you see the endocrinologist about the fatigue and low cortisol  Start the prescription vitamin D, once a week for 8 weeks, then over-the-counter 1000 iu vitamin D3 once a day Start vitamin B12 sublingual 500 or 1000 mcg daily  Recheck labs in 8 weeks, fasting  Increase the simvastatin from 10 mg to 20 mg at night Try to limit saturated fats in your diet (bologna, hot dogs, barbeque, cheeseburgers, hamburgers, steak, bacon, sausage, cheese, etc.) and get more fresh fruits, vegetables, and whole grains

## 2015-06-03 NOTE — Assessment & Plan Note (Addendum)
Reviewed labs; very mildly low cortisol level, truly AM; explained endo may not be excited about this number and will likely recheck and may do another test; regardless, she has excessive fatigue and I'm comfortable referring her to an endocrinologist

## 2015-07-29 ENCOUNTER — Encounter: Payer: Self-pay | Admitting: Family Medicine

## 2015-07-29 ENCOUNTER — Ambulatory Visit (INDEPENDENT_AMBULATORY_CARE_PROVIDER_SITE_OTHER): Payer: BLUE CROSS/BLUE SHIELD | Admitting: Family Medicine

## 2015-07-29 VITALS — BP 138/80 | HR 93 | Temp 97.9°F | Resp 16 | Wt 166.0 lb

## 2015-07-29 DIAGNOSIS — R635 Abnormal weight gain: Secondary | ICD-10-CM

## 2015-07-29 DIAGNOSIS — H6982 Other specified disorders of Eustachian tube, left ear: Secondary | ICD-10-CM

## 2015-07-29 DIAGNOSIS — E559 Vitamin D deficiency, unspecified: Secondary | ICD-10-CM | POA: Diagnosis not present

## 2015-07-29 NOTE — Assessment & Plan Note (Signed)
Patient continues to have weight gain; normal TSH and free T4; appreciate endo seeing her and ruling out thyroid disease as cause of her symptoms and weight gain; encouraged patient to try nutritional yeast as source of B12 since she can't tolerate pills

## 2015-07-29 NOTE — Patient Instructions (Addendum)
Please do call the endocrinology office about the testing Use Afrin over-the-counter for 3 days and 3 days only, follow the package directions Spray -- lay -- roll Do not sleep on your left side for one week  Avoid soft drinks  Check out the information at familydoctor.org entitled "Nutrition for Weight Loss: What You Need to Know about Fad Diets" Try to lose between 1-2 pounds per week by taking in fewer calories and burning off more calories You can succeed by limiting portions, limiting foods dense in calories and fat, becoming more active, and drinking 8 glasses of water a day (64 ounces) Don't skip meals, especially breakfast, as skipping meals may alter your metabolism Do not use over-the-counter weight loss pills or gimmicks that claim rapid weight loss A healthy BMI (or body mass index) is between 18.5 and 24.9 You can calculate your ideal BMI at the NIH website [employer]huhttp://www.nhlbi.nih.gov/health/educational/lose_wt/BMI/bmicalc.htm  Try nutritional yeast as a source of vitamin B12

## 2015-07-29 NOTE — Assessment & Plan Note (Signed)
Continue supplementation  ?

## 2015-07-29 NOTE — Progress Notes (Signed)
BP 138/80 mmHg  Pulse 93  Temp(Src) 97.9 F (36.6 C) (Oral)  Resp 16  Wt 166 lb (75.297 kg)  SpO2 93%   Subjective:    Patient ID: Kylie [name], female    DOB: Jun 02, 1955, 59 y.o.   MRN: [id number]  HPI: Kylie [name] is a 60 y.o. female  Chief Complaint  Patient presents with  . Follow-up  . Sinusitis   She says that she saw the endocrinologist about her cortisol; further testing planned; she has not heard back from insurance company; she did gain more weight since last visit; no fruit juice, no sweet tea; drinks Sprite; she eats like a bird, skips meals  Problems with sinuses and ears have been killing her; hurts across the top of the head; she went to urgent care; she sat down on the chair, she looked at her ear, pt thought she had a sinus infection; she did give her augmentin, but patient did not take it; she did not want either pill or liquid; she has been nauseated; not taking her temperature; she knows her ear is infected; it has been killing her; inside of nose is hurting; eyebrows and sinus stuff; getting worse  Vitamin D deficiency noted on last labs; taking supplement  She could not tolerate the vitamin B12 pills  Relevant past medical, surgical, family and social history reviewed Past Medical History  Diagnosis Date  . Diverticulitis     hospitalized several times  . Hyperlipidemia   . GERD (gastroesophageal reflux disease)   . Hiatal hernia   . Asthma   . Murmur   . CKD (chronic kidney disease) stage 3, GFR 30-59 ml/min   . Hypercholesteremia   . Heart murmur   . Leaky heart valve   . Hypertension   . Anemia   . Anxiety     followed by Dr. Janeece Riggers  . Depression     followed by Dr. Janeece Riggers  . Panic attacks     followed by Dr. Janeece Riggers  . Overactive bladder   . AVM (arteriovenous malformation) March 2016    distal left thigh  . Low serum cortisol level (HCC) 06/03/2015  . Chronic kidney disease, stage II (mild)    Family History  Problem Relation Age of Onset    . Cancer Mother     colon  . Kidney disease Mother   . Arthritis Father   . Alcohol abuse Father   . Mental illness Father   . Diverticulitis Father   . Asthma Sister   . Hyperlipidemia Sister   . Hypertension Sister   . Migraines Sister   . Heart disease Brother   . Hyperlipidemia Brother   . Hypertension Brother    Social History  Substance Use Topics  . Smoking status: Former Smoker -- 2.00 packs/day for 25 years    Types: Cigarettes    Quit date: 01/10/2011  . Smokeless tobacco: Never Used  . Alcohol Use: No   Interim medical history since last visit reviewed. Allergies and medications reviewed  Review of Systems Per HPI unless specifically indicated above     Objective:    BP 138/80 mmHg  Pulse 93  Temp(Src) 97.9 F (36.6 C) (Oral)  Resp 16  Wt 166 lb (75.297 kg)  SpO2 93%  Wt Readings from Last 3 Encounters:  07/29/15 166 lb (75.297 kg)  06/03/15 164 lb 6.4 oz (74.571 kg)  05/20/15 161 lb (73.029 kg)    Physical Exam  Constitutional: She appears well-developed  and well-nourished.  Minor weight gain of less than 2 pounds over last 8 weeks  HENT:  Right Ear: Hearing, tympanic membrane, external ear and ear canal normal.  Left Ear: Hearing, external ear and ear canal normal. A middle ear effusion (slightly cloudy fluid behind the LEFT TM) is present.  Nose: Rhinorrhea (scant, no purulence) present.  Mouth/Throat: Mucous membranes are normal. No oropharyngeal exudate, posterior oropharyngeal edema or posterior oropharyngeal erythema.  Cardiovascular: Normal rate and regular rhythm.   Pulmonary/Chest: Effort normal and breath sounds normal.  Psychiatric: Her mood appears not anxious. She does not exhibit a depressed mood.    Results for orders placed or performed during the hospital encounter of 05/27/15  Hemoglobin A1c  Result Value Ref Range   Hgb A1c MFr Bld 6.2 (H) 4.0 - 6.0 %  CBC with Differential/Platelet  Result Value Ref Range   WBC 9.4 3.6 -  11.0 K/uL   RBC 5.00 3.80 - 5.20 MIL/uL   Hemoglobin 13.7 12.0 - 16.0 g/dL   HCT 56.2 13.0 - 86.5 %   MCV 84.4 80.0 - 100.0 fL   MCH 27.4 26.0 - 34.0 pg   MCHC 32.5 32.0 - 36.0 g/dL   RDW 78.4 69.6 - 29.5 %   Platelets 253 150 - 440 K/uL   Neutrophils Relative % 64% %   Neutro Abs 6.1 1.4 - 6.5 K/uL   Lymphocytes Relative 25% %   Lymphs Abs 2.3 1.0 - 3.6 K/uL   Monocytes Relative 7% %   Monocytes Absolute 0.7 0.2 - 0.9 K/uL   Eosinophils Relative 3% %   Eosinophils Absolute 0.3 0 - 0.7 K/uL   Basophils Relative 1% %   Basophils Absolute 0.1 0 - 0.1 K/uL  T4, free  Result Value Ref Range   Free T4 0.89 0.61 - 1.12 ng/dL  TSH  Result Value Ref Range   TSH 1.380 0.350 - 4.500 uIU/mL  Lipid panel  Result Value Ref Range   Cholesterol 153 0 - 200 mg/dL   Triglycerides 284 <132 mg/dL   HDL 38 (L) >44 mg/dL   Total CHOL/HDL Ratio 4.0 RATIO   VLDL 20 0 - 40 mg/dL   LDL Cholesterol 95 0 - 99 mg/dL  Comprehensive metabolic panel  Result Value Ref Range   Sodium 142 135 - 145 mmol/L   Potassium 4.3 3.5 - 5.1 mmol/L   Chloride 105 101 - 111 mmol/L   CO2 32 22 - 32 mmol/L   Glucose, Bld 97 65 - 99 mg/dL   BUN 14 6 - 20 mg/dL   Creatinine, Ser 0.10 0.44 - 1.00 mg/dL   Calcium 9.1 8.9 - 27.2 mg/dL   Total Protein 7.7 6.5 - 8.1 g/dL   Albumin 3.6 3.5 - 5.0 g/dL   AST 15 15 - 41 U/L   ALT 18 14 - 54 U/L   Alkaline Phosphatase 68 38 - 126 U/L   Total Bilirubin 0.4 0.3 - 1.2 mg/dL   GFR calc non Af Amer >60 >60 mL/min   GFR calc Af Amer >60 >60 mL/min   Anion gap 5 5 - 15  VITAMIN D 25 Hydroxy (Vit-D Deficiency, Fractures)  Result Value Ref Range   Vit D, 25-Hydroxy 22.4 (L) 30.0 - 100.0 ng/mL  Vitamin B12  Result Value Ref Range   Vitamin B-12 383 180 - 914 pg/mL  Cortisol  Result Value Ref Range   Cortisol, Plasma 5.7 ug/dL      Assessment & Plan:  Problem List Items Addressed This Visit      Other   Weight gain, abnormal    Patient continues to have weight gain;  normal TSH and free T4; appreciate endo seeing her and ruling out thyroid disease as cause of her symptoms and weight gain; encouraged patient to try nutritional yeast as source of B12 since she can't tolerate pills      Vitamin D deficiency    Continue supplementation       Other Visit Diagnoses    Eustachian tube dysfunction, left    -  Primary    explained this does not appear to be an ear infection; I don't think antibiotics are warranted; Afrin x 3 days, 3 days only       Follow up plan: No Follow-up on file.  An after-visit summary was printed and given to the patient at check-out.  Please see the patient instructions which may contain other information and recommendations beyond what is mentioned above in the assessment and plan.  No orders of the defined types were placed in this encounter.    No orders of the defined types were placed in this encounter.

## 2015-09-29 ENCOUNTER — Other Ambulatory Visit: Payer: Self-pay | Admitting: Family Medicine

## 2015-12-16 ENCOUNTER — Other Ambulatory Visit: Payer: Self-pay | Admitting: Family Medicine

## 2016-03-03 ENCOUNTER — Telehealth: Payer: Self-pay

## 2016-03-03 NOTE — Telephone Encounter (Signed)
Sorry, but I'll need to see her for this; if no appts, suggest urgent care

## 2016-03-03 NOTE — Telephone Encounter (Signed)
Patient called states has vaginal atrophy and thinks she has BV/yeast with itching and irritation.  Wants to see if you can call her in something without coming in?

## 2016-03-03 NOTE — Telephone Encounter (Signed)
Left detailed voicemail

## 2016-03-06 ENCOUNTER — Ambulatory Visit (INDEPENDENT_AMBULATORY_CARE_PROVIDER_SITE_OTHER): Payer: BLUE CROSS/BLUE SHIELD | Admitting: Family Medicine

## 2016-03-06 ENCOUNTER — Encounter: Payer: Self-pay | Admitting: Family Medicine

## 2016-03-06 ENCOUNTER — Ambulatory Visit
Admission: RE | Admit: 2016-03-06 | Discharge: 2016-03-06 | Disposition: A | Discharge status: Home / Self Care | Payer: BLUE CROSS/BLUE SHIELD | Source: Ambulatory Visit | Attending: Family Medicine | Admitting: Family Medicine

## 2016-03-06 ENCOUNTER — Telehealth: Payer: Self-pay

## 2016-03-06 VITALS — BP 130/86 | HR 113 | Temp 97.4°F | Resp 16 | Wt 159.5 lb

## 2016-03-06 DIAGNOSIS — R718 Other abnormality of red blood cells: Secondary | ICD-10-CM | POA: Diagnosis not present

## 2016-03-06 DIAGNOSIS — R938 Abnormal findings on diagnostic imaging of other specified body structures: Secondary | ICD-10-CM | POA: Diagnosis not present

## 2016-03-06 DIAGNOSIS — E785 Hyperlipidemia, unspecified: Secondary | ICD-10-CM | POA: Diagnosis not present

## 2016-03-06 DIAGNOSIS — R9389 Abnormal findings on diagnostic imaging of other specified body structures: Secondary | ICD-10-CM

## 2016-03-06 DIAGNOSIS — Z5181 Encounter for therapeutic drug level monitoring: Secondary | ICD-10-CM

## 2016-03-06 DIAGNOSIS — R0989 Other specified symptoms and signs involving the circulatory and respiratory systems: Secondary | ICD-10-CM | POA: Diagnosis not present

## 2016-03-06 DIAGNOSIS — I10 Essential (primary) hypertension: Secondary | ICD-10-CM | POA: Diagnosis not present

## 2016-03-06 DIAGNOSIS — E538 Deficiency of other specified B group vitamins: Secondary | ICD-10-CM

## 2016-03-06 DIAGNOSIS — R7301 Impaired fasting glucose: Secondary | ICD-10-CM | POA: Diagnosis not present

## 2016-03-06 DIAGNOSIS — E559 Vitamin D deficiency, unspecified: Secondary | ICD-10-CM

## 2016-03-06 DIAGNOSIS — N898 Other specified noninflammatory disorders of vagina: Secondary | ICD-10-CM | POA: Diagnosis not present

## 2016-03-06 NOTE — Patient Instructions (Signed)
Please have a chest xray across the street Return tomorrow for labs

## 2016-03-06 NOTE — Telephone Encounter (Signed)
Were you going to call her something in for the BV or wait till the lab come back?

## 2016-03-06 NOTE — Progress Notes (Signed)
BP 130/86   Pulse (!) 113   Temp 97.4 F (36.3 C) (Oral)   Resp 16   Wt 159 lb 8 oz (72.3 kg)   SpO2 93%   BMI 33.34 kg/m    Subjective:    Patient ID: Kylie [name], female    DOB: [birth year], 61 y.o.   MRN: [id number]  HPI: Kylie [name] is a 61 y.o. female  Chief Complaint  Patient presents with  . Vaginal Itching    might be yeast/BV  . Shortness of Breath    Still having from previous months from pneumonia   Patient had pneumonia back in October 2017, went to [facility], note reviewed Pulse ox 100% there Getting short of breath with chores; feels like her breath is leaving her Scares her when she can't breathe She does cough some; sister stays on her all the time about this; in the morning, it's green Quit smoking almost 5 years ago Uses the SABA and doesn't feel like it helps  Here are her instructions from [facility] note: Your tests today are all reassuringly normal. I think you are having a flare of your COPD which is causing your cough and shortness of breath.  There is a very small area on your chest xray that could represent a small pneumonia, but which could also be a chronic finding (we have no old films for comparison).   I will treat you with Zithromax for any infection you may be struggling with.  Please take your medications as prescribed.   Albuterol can be used 2-4 puffs every 2-4 hours as needed. Please use the provided aerochamber with ALL of your inhaled medications.  --------------------------------------------------------- Chest xray Oct 27, 2015: EXAM: CHEST TWO VIEW DATE: 10/27/2015 2:48 PM ACCESSION: [id number] UN DICTATED: 10/27/2015 2:52 PM INTERPRETATION LOCATION: [facility]  CLINICAL INDICATION: 61 years old Female with COUGH--  COMPARISON: Chest radiograph dated 12/08/2003  TECHNIQUE: PA and lateral views of the chest.  FINDINGS: Hyperinflation.  Patchy right basilar opacity without definite correlate on the lateral  radiograph.  No pneumothorax or pleural effusion.  Cardiomediastinal silhouette is within normal limits. ------------------------------------------------------------------  Saw psychiatrist and he suggested disability  She is here for vaginitis; pain on the left side; messing with her stomach; not sure about discharge; white and creamy, not thick; normal discharge; no strong smell; no fever  Depression screen [facility] 2/9 03/06/2016 06/03/2015 05/20/2015 05/20/2015  Decreased Interest 0 0 0 0  Down, Depressed, Hopeless 0 0 0 0  PHQ - 2 Score 0 0 0 0   Relevant past medical, surgical, family and social history reviewed Past Medical History:  Diagnosis Date  . Anemia   . Anxiety    followed by Dr. Janeece Riggers  . Asthma   . 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 Chronic kidney disease, stage II (mild)   . CKD (chronic kidney disease) stage 3, GFR 30-59 ml/min   . Depression    followed by Dr. Janeece Riggers  . Diverticulitis    hospitalized several times  . GERD (gastroesophageal reflux disease)   . Heart murmur   . Hiatal hernia   . Hypercholesteremia   . Hyperlipidemia   . Hypertension   . Leaky heart valve   . Low serum cortisol level (HCC) 06/03/2015  . Murmur   . Overactive bladder   . Panic attacks    followed by Dr. Janeece Riggers   Past Surgical History:  Procedure Laterality Date  . ABDOMINAL HYSTERECTOMY  2007   Total with BSO  . APPENDECTOMY  2006  . CARDIAC CATHETERIZATION N/A 01/07/2015   Procedure: Left Heart Cath and Coronary Angiography;  Surgeon: Lamar Blinks, MD;  Location: [facility] INVASIVE CV LAB;  Service: Cardiovascular;  Laterality: N/A;  . COLONOSCOPY  2013   Dr Wandalee Ferdinand at [facility]  . SIGMOID RESECTION / RECTOPEXY  10/17/13  . TUBAL LIGATION  1981  . UPPER GI ENDOSCOPY  2013   Dr Wandalee Ferdinand at [facility]   Family History  Problem Relation Age of Onset  . Cancer Mother     colon  . Kidney disease Mother   . Arthritis Father   . Alcohol abuse Father    . Mental illness Father   . Diverticulitis Father   . Asthma Sister   . Hyperlipidemia Sister   . Hypertension Sister   . Migraines Sister   . Heart disease Brother   . Hyperlipidemia Brother   . Hypertension Brother   . Stroke Brother   . Hypertension Daughter   . Hypertension Son   . Hypertension Sister   . Hypertension Sister   . Hypertension Sister   . Arthritis Sister   . Heart disease Sister   . Hypertension Sister   . Hypertension Brother   . Hyperlipidemia Brother   . Down syndrome Brother    Social History  Substance Use Topics  . Smoking status: Former Smoker    Packs/day: 2.00    Years: 25.00    Types: Cigarettes    Quit date: 01/10/2011  . Smokeless tobacco: Never Used  . Alcohol use No   Interim medical history since last visit reviewed. Allergies and medications reviewed  Review of Systems Per HPI unless specifically indicated above     Objective:    BP 130/86   Pulse (!) 113   Temp 97.4 F (36.3 C) (Oral)   Resp 16   Wt 159 lb 8 oz (72.3 kg)   SpO2 93%   BMI 33.34 kg/m   Wt Readings from Last 3 Encounters:  03/06/16 159 lb 8 oz (72.3 kg)  07/29/15 166 lb (75.3 kg)  06/03/15 164 lb 6.4 oz (74.6 kg)    Physical Exam  Constitutional: She appears well-developed and well-nourished. No distress.  Obese, but weight down 6-1/2 pounds over last 6+ months  HENT:  Head: Normocephalic and atraumatic.  Eyes: EOM are normal. No scleral icterus.  Neck: No thyromegaly present.  Cardiovascular: Normal rate, regular rhythm and normal heart sounds.   No murmur heard. Pulmonary/Chest: Effort normal and breath sounds normal. No respiratory distress. She has no wheezes.  Abdominal: Soft. Bowel sounds are normal. She exhibits no distension.  Genitourinary: No erythema or bleeding in the vagina. Vaginal discharge found.  Musculoskeletal: Normal range of motion. She exhibits no edema.  Neurological: She is alert. She exhibits normal muscle tone.  Skin: Skin  is warm and dry. She is not diaphoretic. No pallor.  Psychiatric: She has a normal mood and affect. Her behavior is normal. Judgment and thought content normal. Her mood appears not anxious. She does not exhibit a depressed mood.      Assessment & Plan:   Problem List Items Addressed This Visit      Cardiovascular and Mediastinum   Essential hypertension, benign    controlled        Endocrine   IFG (impaired fasting glucose)    Check A1c and glucose      Relevant Orders  Hemoglobin A1c (Completed)     Other   Vitamin D deficiency    Check level      Relevant Orders   VITAMIN D 25 Hydroxy (Vit-D Deficiency, Fractures) (Completed)   Vitamin B12 deficiency    Check level      Relevant Orders   Vitamin B12 (Completed)   Medication monitoring encounter    Check labs      Relevant Orders   COMPLETE METABOLIC PANEL WITH GFR (Completed)   Hyperlipidemia LDL goal <70    check level      Relevant Orders   Lipid panel (Completed)   Elevated red blood cell count    Check CBC      Relevant Orders   CBC with Differential/Platelet (Completed)    Other Visit Diagnoses    Vaginal discharge    -  Primary   Relevant Orders   WET PREP BY MOLECULAR PROBE (Completed)   Abnormal chest x-ray       Relevant Orders   DG Chest 2 View (Completed)   Pulmonary hyperinflation       Relevant Orders   DG Chest 2 View (Completed)      Follow up plan: No Follow-up on file.  An after-visit summary was printed and given to the patient at check-out.  Please see the patient instructions which may contain other information and recommendations beyond what is mentioned above in the assessment and plan.  No orders of the defined types were placed in this encounter.   Orders Placed This Encounter  Procedures  . WET PREP BY MOLECULAR PROBE  . DG Chest 2 View  . Lipid panel  . CBC with Differential/Platelet  . Hemoglobin A1c  . COMPLETE METABOLIC PANEL WITH GFR  . VITAMIN D 25  Hydroxy (Vit-D Deficiency, Fractures)  . Vitamin B12

## 2016-03-06 NOTE — Telephone Encounter (Signed)
Wait for labs

## 2016-03-07 ENCOUNTER — Telehealth: Payer: Self-pay | Admitting: Family Medicine

## 2016-03-07 DIAGNOSIS — Z5181 Encounter for therapeutic drug level monitoring: Secondary | ICD-10-CM | POA: Insufficient documentation

## 2016-03-07 LAB — COMPLETE METABOLIC PANEL WITH GFR
ALT: 21 U/L (ref 6–29)
AST: 17 U/L (ref 10–35)
Albumin: 3.8 g/dL (ref 3.6–5.1)
Alkaline Phosphatase: 76 U/L (ref 33–130)
BILIRUBIN TOTAL: 0.3 mg/dL (ref 0.2–1.2)
BUN: 17 mg/dL (ref 7–25)
CHLORIDE: 104 mmol/L (ref 98–110)
CO2: 31 mmol/L (ref 20–31)
Calcium: 9.4 mg/dL (ref 8.6–10.4)
Creat: 1.17 mg/dL — ABNORMAL HIGH (ref 0.50–1.05)
GFR, EST NON AFRICAN AMERICAN: 51 mL/min — AB (ref >=60)
GFR, Est African American: 59 mL/min — ABNORMAL LOW (ref >=60)
GLUCOSE: 91 mg/dL (ref 65–99)
Potassium: 5 mmol/L (ref 3.5–5.3)
Sodium: 143 mmol/L (ref 135–146)
TOTAL PROTEIN: 7.7 g/dL (ref 6.1–8.1)

## 2016-03-07 LAB — CBC WITH DIFFERENTIAL/PLATELET
BASOS PCT: 1 %
Basophils Absolute: 91 cells/uL (ref 0–200)
Eosinophils Absolute: 455 cells/uL (ref 15–500)
Eosinophils Relative: 5 %
HEMATOCRIT: 46.4 % — AB (ref 35.0–45.0)
HEMOGLOBIN: 15 g/dL (ref 11.7–15.5)
LYMPHS ABS: 2639 {cells}/uL (ref 850–3900)
LYMPHS PCT: 29 %
MCH: 28.4 pg (ref 27.0–33.0)
MCHC: 32.3 g/dL (ref 32.0–36.0)
MCV: 87.9 fL (ref 80.0–100.0)
MONO ABS: 728 {cells}/uL (ref 200–950)
MPV: 10.1 fL (ref 7.5–12.5)
Monocytes Relative: 8 %
Neutro Abs: 5187 cells/uL (ref 1500–7800)
Neutrophils Relative %: 57 %
Platelets: 290 10*3/uL (ref 140–400)
RBC: 5.28 MIL/uL — AB (ref 3.80–5.10)
RDW: 14.7 % (ref 11.0–15.0)
WBC: 9.1 10*3/uL (ref 3.8–10.8)

## 2016-03-07 LAB — LIPID PANEL
Cholesterol: 215 mg/dL — ABNORMAL HIGH (ref <200)
HDL: 38 mg/dL — ABNORMAL LOW (ref >50)
LDL Cholesterol: 149 mg/dL — ABNORMAL HIGH (ref <100)
Total CHOL/HDL Ratio: 5.7 Ratio — ABNORMAL HIGH (ref <5.0)
Triglycerides: 141 mg/dL (ref <150)
VLDL: 28 mg/dL (ref <30)

## 2016-03-07 LAB — WET PREP BY MOLECULAR PROBE
Candida species: NOT DETECTED
GARDNERELLA VAGINALIS: NOT DETECTED
Trichomonas vaginosis: DETECTED — AB

## 2016-03-07 MED ORDER — METRONIDAZOLE 500 MG PO TABS: 500.0000 mg | ORAL_TABLET | Freq: Two times a day (BID) | ORAL | 0 refills | Status: DC

## 2016-03-07 NOTE — Assessment & Plan Note (Signed)
Check CBC 

## 2016-03-07 NOTE — Assessment & Plan Note (Signed)
controlled 

## 2016-03-07 NOTE — Assessment & Plan Note (Signed)
Check level 

## 2016-03-07 NOTE — Assessment & Plan Note (Signed)
Check A1c and glucose 

## 2016-03-07 NOTE — Telephone Encounter (Signed)
Wet mount positive for trich; talked to patient; [facility] given Treat with metronidazole

## 2016-03-07 NOTE — Assessment & Plan Note (Signed)
Check labs 

## 2016-03-07 NOTE — Assessment & Plan Note (Signed)
-   check level

## 2016-03-08 ENCOUNTER — Other Ambulatory Visit: Payer: Self-pay | Admitting: Family Medicine

## 2016-03-08 LAB — HEMOGLOBIN A1C
Hgb A1c MFr Bld: 6.3 % — ABNORMAL HIGH (ref <5.7)
MEAN PLASMA GLUCOSE: 134 mg/dL

## 2016-03-08 LAB — VITAMIN B12: VITAMIN B 12: 609 pg/mL (ref 200–1100)

## 2016-03-08 LAB — VITAMIN D 25 HYDROXY (VIT D DEFICIENCY, FRACTURES): Vit D, 25-Hydroxy: 13 ng/mL — ABNORMAL LOW (ref 30–100)

## 2016-03-08 MED ORDER — VITAMIN D (ERGOCALCIFEROL) 1.25 MG (50000 UNIT) PO CAPS: 50000.0000 [IU] | ORAL_CAPSULE | ORAL | 1 refills | Status: AC

## 2016-03-08 NOTE — Progress Notes (Signed)
Start vit D Rx; appt to discuss all the other labs

## 2016-03-14 ENCOUNTER — Emergency Department: Payer: BLUE CROSS/BLUE SHIELD

## 2016-03-14 ENCOUNTER — Encounter: Payer: Self-pay | Admitting: Family Medicine

## 2016-03-14 ENCOUNTER — Ambulatory Visit (INDEPENDENT_AMBULATORY_CARE_PROVIDER_SITE_OTHER): Payer: BLUE CROSS/BLUE SHIELD | Admitting: Family Medicine

## 2016-03-14 ENCOUNTER — Encounter: Payer: Self-pay | Admitting: Emergency Medicine

## 2016-03-14 ENCOUNTER — Observation Stay
Admission: EM | Admit: 2016-03-14 | Discharge: 2016-03-15 | Disposition: A | Discharge status: Home / Self Care | Payer: BLUE CROSS/BLUE SHIELD | Attending: Internal Medicine | Admitting: Internal Medicine

## 2016-03-14 VITALS — BP 120/70 | HR 97 | Temp 98.1°F | Wt 162.7 lb

## 2016-03-14 DIAGNOSIS — R0902 Hypoxemia: Secondary | ICD-10-CM

## 2016-03-14 DIAGNOSIS — J441 Chronic obstructive pulmonary disease with (acute) exacerbation: Secondary | ICD-10-CM | POA: Diagnosis present

## 2016-03-14 DIAGNOSIS — J449 Chronic obstructive pulmonary disease, unspecified: Secondary | ICD-10-CM | POA: Diagnosis present

## 2016-03-14 DIAGNOSIS — F419 Anxiety disorder, unspecified: Secondary | ICD-10-CM | POA: Insufficient documentation

## 2016-03-14 DIAGNOSIS — N183 Chronic kidney disease, stage 3 (moderate): Secondary | ICD-10-CM | POA: Insufficient documentation

## 2016-03-14 DIAGNOSIS — I129 Hypertensive chronic kidney disease with stage 1 through stage 4 chronic kidney disease, or unspecified chronic kidney disease: Secondary | ICD-10-CM | POA: Insufficient documentation

## 2016-03-14 DIAGNOSIS — J9601 Acute respiratory failure with hypoxia: Principal | ICD-10-CM | POA: Insufficient documentation

## 2016-03-14 DIAGNOSIS — Z79899 Other long term (current) drug therapy: Secondary | ICD-10-CM | POA: Insufficient documentation

## 2016-03-14 HISTORY — DX: Chronic obstructive pulmonary disease, unspecified: J44.9

## 2016-03-14 LAB — BASIC METABOLIC PANEL
Anion gap: 8 (ref 5–15)
BUN: 17 mg/dL (ref 6–20)
CO2: 32 mmol/L (ref 22–32)
CREATININE: 1.07 mg/dL — AB (ref 0.44–1.00)
Calcium: 9.8 mg/dL (ref 8.9–10.3)
Chloride: 98 mmol/L — ABNORMAL LOW (ref 101–111)
GFR calc Af Amer: >60 mL/min (ref >60)
GFR, EST NON AFRICAN AMERICAN: 56 mL/min — AB (ref >60)
GLUCOSE: 88 mg/dL (ref 65–99)
POTASSIUM: 4 mmol/L (ref 3.5–5.1)
SODIUM: 138 mmol/L (ref 135–145)

## 2016-03-14 LAB — CBC
HEMATOCRIT: 46.8 % (ref 35.0–47.0)
Hemoglobin: 15.2 g/dL (ref 12.0–16.0)
MCH: 28.4 pg (ref 26.0–34.0)
MCHC: 32.5 g/dL (ref 32.0–36.0)
MCV: 87.3 fL (ref 80.0–100.0)
PLATELETS: 264 10*3/uL (ref 150–440)
RBC: 5.36 MIL/uL — ABNORMAL HIGH (ref 3.80–5.20)
RDW: 14.6 % — AB (ref 11.5–14.5)
WBC: 12.2 10*3/uL — AB (ref 3.6–11.0)

## 2016-03-14 LAB — TROPONIN I

## 2016-03-14 LAB — BRAIN NATRIURETIC PEPTIDE: B Natriuretic Peptide: 14 pg/mL (ref 0.0–100.0)

## 2016-03-14 LAB — INFLUENZA PANEL BY PCR (TYPE A & B)
INFLAPCR: NEGATIVE
Influenza B By PCR: NEGATIVE

## 2016-03-14 MED ORDER — IPRATROPIUM-ALBUTEROL 0.5-2.5 (3) MG/3ML IN SOLN
3.0000 mL | Freq: Once | RESPIRATORY_TRACT | Status: AC
  Administered 2016-03-14: 3 mL via RESPIRATORY_TRACT
  Filled 2016-03-14: qty 3

## 2016-03-14 MED ORDER — METRONIDAZOLE 500 MG PO TABS
500.0000 mg | ORAL_TABLET | Freq: Two times a day (BID) | ORAL | Status: DC
  Administered 2016-03-14 – 2016-03-15 (×2): 500 mg via ORAL
  Filled 2016-03-14 (×2): qty 1

## 2016-03-14 MED ORDER — MONTELUKAST SODIUM 10 MG PO TABS
10.0000 mg | ORAL_TABLET | Freq: Every day | ORAL | Status: DC
  Administered 2016-03-14 – 2016-03-15 (×2): 10 mg via ORAL
  Filled 2016-03-14 (×2): qty 1

## 2016-03-14 MED ORDER — IPRATROPIUM-ALBUTEROL 0.5-2.5 (3) MG/3ML IN SOLN
3.0000 mL | RESPIRATORY_TRACT | Status: DC
  Administered 2016-03-15 (×4): 3 mL via RESPIRATORY_TRACT
  Filled 2016-03-14 (×4): qty 3

## 2016-03-14 MED ORDER — INFLUENZA VAC SPLIT QUAD 0.5 ML IM SUSY: 0.5000 mL | PREFILLED_SYRINGE | INTRAMUSCULAR | Status: DC

## 2016-03-14 MED ORDER — SODIUM CHLORIDE 0.9 % IV SOLN: 250.0000 mL | INTRAVENOUS | Status: DC | PRN

## 2016-03-14 MED ORDER — ONDANSETRON HCL 4 MG/2ML IJ SOLN: 4.0000 mg | Freq: Four times a day (QID) | INTRAMUSCULAR | Status: DC | PRN

## 2016-03-14 MED ORDER — ONDANSETRON HCL 4 MG PO TABS: 4.0000 mg | ORAL_TABLET | Freq: Four times a day (QID) | ORAL | Status: DC | PRN

## 2016-03-14 MED ORDER — ALBUTEROL SULFATE (2.5 MG/3ML) 0.083% IN NEBU: 2.5000 mg | INHALATION_SOLUTION | RESPIRATORY_TRACT | Status: DC | PRN

## 2016-03-14 MED ORDER — METHYLPREDNISOLONE SODIUM SUCC 125 MG IJ SOLR
60.0000 mg | Freq: Two times a day (BID) | INTRAMUSCULAR | Status: DC
  Administered 2016-03-15: 60 mg via INTRAVENOUS
  Filled 2016-03-14: qty 2

## 2016-03-14 MED ORDER — AMOXICILLIN-POT CLAVULANATE 875-125 MG PO TABS
1.0000 | ORAL_TABLET | Freq: Once | ORAL | Status: AC
  Administered 2016-03-14: 1 via ORAL
  Filled 2016-03-14: qty 1

## 2016-03-14 MED ORDER — METHYLPREDNISOLONE SODIUM SUCC 125 MG IJ SOLR
125.0000 mg | Freq: Once | INTRAMUSCULAR | Status: AC
  Administered 2016-03-14: 125 mg via INTRAVENOUS
  Filled 2016-03-14: qty 2

## 2016-03-14 MED ORDER — ALPRAZOLAM 1 MG PO TABS
1.0000 mg | ORAL_TABLET | Freq: Three times a day (TID) | ORAL | Status: DC | PRN
  Administered 2016-03-15: 1 mg via ORAL
  Filled 2016-03-14: qty 1

## 2016-03-14 MED ORDER — METOPROLOL SUCCINATE ER 50 MG PO TB24
50.0000 mg | ORAL_TABLET | Freq: Every day | ORAL | Status: DC
  Administered 2016-03-15: 50 mg via ORAL
  Filled 2016-03-14: qty 1

## 2016-03-14 MED ORDER — ACETAMINOPHEN 650 MG RE SUPP: 650.0000 mg | Freq: Four times a day (QID) | RECTAL | Status: DC | PRN

## 2016-03-14 MED ORDER — SODIUM CHLORIDE 0.9% FLUSH: 3.0000 mL | INTRAVENOUS | Status: DC | PRN

## 2016-03-14 MED ORDER — POLYETHYLENE GLYCOL 3350 17 G PO PACK: 17.0000 g | PACK | Freq: Every day | ORAL | Status: DC | PRN

## 2016-03-14 MED ORDER — ENOXAPARIN SODIUM 40 MG/0.4ML ~~LOC~~ SOLN
40.0000 mg | SUBCUTANEOUS | Status: DC
  Administered 2016-03-14: 40 mg via SUBCUTANEOUS
  Filled 2016-03-14: qty 0.4

## 2016-03-14 MED ORDER — ACETAMINOPHEN 325 MG PO TABS: 650.0000 mg | ORAL_TABLET | Freq: Four times a day (QID) | ORAL | Status: DC | PRN

## 2016-03-14 MED ORDER — SODIUM CHLORIDE 0.9% FLUSH
3.0000 mL | Freq: Two times a day (BID) | INTRAVENOUS | Status: DC
  Administered 2016-03-14 – 2016-03-15 (×2): 3 mL via INTRAVENOUS

## 2016-03-14 NOTE — H&P (Signed)
[facility] [place] [facility]   PATIENT NAME: Kylie [name]    MR#:  [id number]  DATE OF BIRTH:  [birth year]  DATE OF ADMISSION:  03/14/2016  PRIMARY CARE PHYSICIAN: Baruch Gouty, MD   REQUESTING/REFERRING PHYSICIAN: Dr. Don Perking  CHIEF COMPLAINT:   Chief Complaint  Patient presents with  . Dizziness    HISTORY OF PRESENT ILLNESS:  Kylie [name]  is a 61 y.o. female with a known history of COPD, anxiety, hypertension, obesity presents to the emergency room sent in by her primary care physician after she presented to the office with shortness of breath. She was found to have wheezing and saturations 87% on room air. Here in the emergency room her chest x-ray shows no pneumonia. She is needing 3 L oxygen for hypoxia. She received multiple breathing treatments and still continues to desat on room air and is being admitted to the hospitalist service. He has diagnosis of COPD. Quit smoking 5 years back but her shortness of breath has continued to worsen over the past few months. She does complain of orthopnea but no lower extremity edema. Chest x-ray is clear. No history of CHF.  She has had allergies to multiple diuretics as per the patient including Lasix and Bumex, she is unsure of the names though.. Although these are not mentioned in her allergy list.  PAST MEDICAL HISTORY:   Past Medical History:  Diagnosis Date  . Anemia   . Anxiety    followed by Dr. Janeece Riggers  . Asthma   . 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 Chronic kidney disease, stage II (mild)   . CKD (chronic kidney disease) stage 3, GFR 30-59 ml/min   . COPD (chronic obstructive pulmonary disease) (HCC)   . Depression    followed by Dr. Janeece Riggers  . Diverticulitis    hospitalized several times  . GERD (gastroesophageal reflux disease)   . Heart murmur   . Hiatal hernia   . Hypercholesteremia   . Hyperlipidemia   . Hypertension   . Leaky heart valve   . Low serum cortisol  level (HCC) 06/03/2015  . Murmur   . Overactive bladder   . Panic attacks    followed by Dr. Janeece Riggers    PAST SURGICAL HISTORY:   Past Surgical History:  Procedure Laterality Date  . ABDOMINAL HYSTERECTOMY  2007   Total with BSO  . APPENDECTOMY  2006  . CARDIAC CATHETERIZATION N/A 01/07/2015   Procedure: Left Heart Cath and Coronary Angiography;  Surgeon: Lamar Blinks, MD;  Location: [facility] INVASIVE CV LAB;  Service: Cardiovascular;  Laterality: N/A;  . COLONOSCOPY  2013   Dr Wandalee Ferdinand at [facility]  . SIGMOID RESECTION / RECTOPEXY  10/17/13  . TUBAL LIGATION  1981  . UPPER GI ENDOSCOPY  2013   Dr Wandalee Ferdinand at [facility]    SOCIAL HISTORY:   Social History  Substance Use Topics  . Smoking status: Former Smoker    Packs/day: 2.00    Years: 25.00    Types: Cigarettes    Quit date: 01/10/2011  . Smokeless tobacco: Never Used  . Alcohol use No    FAMILY HISTORY:   Family History  Problem Relation Age of Onset  . Cancer Mother     colon  . Kidney disease Mother   . Arthritis Father   . Alcohol abuse Father   . Mental illness Father   . Diverticulitis Father   . Asthma Sister   .  Hyperlipidemia Sister   . Hypertension Sister   . Migraines Sister   . Heart disease Brother   . Hyperlipidemia Brother   . Hypertension Brother   . Stroke Brother   . Hypertension Daughter   . Hypertension Son   . Hypertension Sister   . Hypertension Sister   . Hypertension Sister   . Arthritis Sister   . Heart disease Sister   . Hypertension Sister   . Hypertension Brother   . Hyperlipidemia Brother   . Down syndrome Brother     DRUG ALLERGIES:   Allergies  Allergen Reactions  . Cephalexin Rash  . Fish Allergy Hives  . Ivp Dye [Iodinated Diagnostic Agents] Other (See Comments) and Anaphylaxis    Blood pressure drops very low eyes roll back in the back of her head cant breathe Shock, low BP  . Nitrofurantoin Rash  . Sulfa Antibiotics Hives and Rash  . Aspirin Other (See  Comments)    Stomach issues  . Ciprofloxacin Hives  . Doxycycline   . Other Other (See Comments)    Myobetria/ dizzness  . Oxycodone Other (See Comments)    Other Reaction: Not Assessed  . Percocet [Oxycodone-Acetaminophen] Other (See Comments)    Mental reaction  . Tequin [Gatifloxacin] Hives  . Latex Rash  . Myrbetriq [Mirabegron] Rash  . Premarin [Conjugated Estrogens] Rash    REVIEW OF SYSTEMS:   Review of Systems  Constitutional: Positive for malaise/fatigue. Negative for chills and fever.  HENT: Negative for sore throat.   Eyes: Negative for blurred vision, double vision and pain.  Respiratory: Positive for cough, shortness of breath and wheezing. Negative for hemoptysis.   Cardiovascular: Positive for orthopnea. Negative for chest pain, palpitations and leg swelling.  Gastrointestinal: Negative for abdominal pain, constipation, diarrhea, heartburn, nausea and vomiting.  Genitourinary: Negative for dysuria and hematuria.  Musculoskeletal: Negative for back pain and joint pain.  Skin: Negative for rash.  Neurological: Positive for weakness. Negative for sensory change, speech change, focal weakness and headaches.  Endo/Heme/Allergies: Does not bruise/bleed easily.  Psychiatric/Behavioral: Negative for depression. The patient is nervous/anxious.    MEDICATIONS AT HOME:   Prior to Admission medications   Medication Sig Start Date End Date Taking? Authorizing Provider  albuterol (PROVENTIL HFA;VENTOLIN HFA) 108 (90 Base) MCG/ACT inhaler Inhale 2 puffs into the lungs every 4 (four) hours as needed for wheezing or shortness of breath. 05/20/15  Yes Kerman Passey, MD  ALPRAZolam Prudy Feeler) 1 MG tablet Take 1 mg by mouth 4 (four) times daily as needed.  09/02/13  Yes Historical Provider, MD  calcium carbonate (TUMS - DOSED IN MG ELEMENTAL CALCIUM) 500 MG chewable tablet Chew 1 tablet by mouth daily.   Yes Historical Provider, MD  metoprolol succinate (TOPROL-XL) 50 MG 24 hr tablet  Take 1 tablet (50 mg total) by mouth daily. 09/29/15  Yes Kerman Passey, MD  metroNIDAZOLE (FLAGYL) 500 MG tablet Take 1 tablet (500 mg total) by mouth 2 (two) times daily. No alcohol or cold medicines that contain alcohol while on this med 03/07/16 03/14/16 Yes Kerman Passey, MD  montelukast (SINGULAIR) 10 MG tablet TAKE 1 TABLET BY MOUTH EVERY NIGHT AT BEDTIME. 12/17/15  Yes Kerman Passey, MD  Vitamin D, Ergocalciferol, (DRISDOL) 50000 units CAPS capsule Take 1 capsule (50,000 Units total) by mouth every 7 (seven) days. 03/08/16 05/03/16 Yes Kerman Passey, MD     VITAL SIGNS:  Blood pressure (!) 113/50, pulse (!) 108, temperature 97.7 F (36.5 C),  temperature source Oral, resp. rate (!) 22, height 4\' 10"  (1.473 m), weight 73 kg (161 lb), SpO2 95 %.  PHYSICAL EXAMINATION:  Physical Exam  GENERAL:  61 y.o.-year-old patient lying in the bed. Morbidly obese. Conversational dyspnea. EYES: Pupils equal, round, reactive to light and accommodation. No scleral icterus. Extraocular muscles intact.  HEENT: Head atraumatic, normocephalic. Oropharynx and nasopharynx clear. No oropharyngeal erythema, moist oral mucosa  NECK:  Supple, no jugular venous distention. No thyroid enlargement, no tenderness.  LUNGS: Increased work of breathing. Bilateral wheezing and decreased air entry CARDIOVASCULAR: S1, S2 normal. No murmurs, rubs, or gallops.  ABDOMEN: Soft, nontender, nondistended. Bowel sounds present. No organomegaly or mass.  EXTREMITIES: No pedal edema, cyanosis, or clubbing. + 2 pedal & radial pulses b/l.   NEUROLOGIC: Cranial nerves II through XII are intact. No focal Motor or sensory deficits appreciated b/l PSYCHIATRIC: The patient is alert and oriented x 3. Anxious SKIN: No obvious rash, lesion, or ulcer.   LABORATORY PANEL:   CBC  Recent Labs Lab 03/14/16 1634  WBC 12.2*  HGB 15.2  HCT 46.8  PLT 264    ------------------------------------------------------------------------------------------------------------------  Chemistries   Recent Labs Lab 03/14/16 1634  NA 138  K 4.0  CL 98*  CO2 32  GLUCOSE 88  BUN 17  CREATININE 1.07*  CALCIUM 9.8   ------------------------------------------------------------------------------------------------------------------  Cardiac Enzymes  Recent Labs Lab 03/14/16 1634  TROPONINI <0.03   ------------------------------------------------------------------------------------------------------------------  RADIOLOGY:  Dg Chest 2 View  Result Date: 03/14/2016 CLINICAL DATA:  Shortness of breath, hypoxia EXAM: CHEST  2 VIEW COMPARISON:  03/06/2016 FINDINGS: Cardiomediastinal silhouette is stable. No infiltrate or pleural effusion. No pulmonary edema. Osteopenia and mild degenerative changes mid thoracic spine. IMPRESSION: No active cardiopulmonary disease. Electronically Signed   By: Natasha MeadLiviu  Pop M.D.   On: 03/14/2016 18:16   IMPRESSION AND PLAN:   * Acute COPD exacerbation with acute hypoxic respiratory failure -IV steroids, Antibiotics - Scheduled Nebulizers - Inhalers -Wean O2 as tolerated - Consult pulmonary if no improvement  * Hypertension Continue home medications  * Anxiety Home meds. Ativan  * DVT prophylaxis Lovenox  All the records are reviewed and case discussed with ED provider. Management plans discussed with the patient, family and they are in agreement.  CODE STATUS: Full code  TOTAL TIME TAKING CARE OF THIS PATIENT: 40 minutes.   Milagros LollSudini, Coltyn Hanning R M.D on 03/14/2016 at 8:45 PM  Between 7am to 6pm - Pager - [phone]  After 6pm go to [email or web address] - password [email or web address] [facility]  [facility] [place] [facility]  Office  [phone]  CC: Primary care physician; Baruch GoutyMelinda Lada, MD  Note: This dictation was prepared with Dragon dictation along with smaller phrase technology. Any transcriptional errors that result from this  process are unintentional.

## 2016-03-14 NOTE — Patient Instructions (Addendum)
Let's have you go to the emergency department for what may be a COPD exacerbation

## 2016-03-14 NOTE — ED Provider Notes (Signed)
[facility] Emergency Department Provider Note  ____________________________________________  Time seen: Approximately 5:50 PM  I have reviewed the triage vital signs and the nursing notes.   HISTORY  Chief Complaint Dizziness   HPI Kylie [name] is a 61 y.o. female with a history of COPD, chronic kidney disease, coronary artery disease, anemia, hypertension, hyperlipidemia who presents for evaluation of hypoxia and dizziness. Patient reports that she's been having progressively worsening shortness of breath and wheezing over the course of the last month. Has been using her inhalers at home. Her symptoms got worse over the last week. She reports that she's been unable to do any of her daily activities such as walking to and from the bathroom, folding clothes without feeling extremely short of breath. Today she went to see her primary care doctor and was noted to be hypoxic at 87% on room air. When she ambulated to her sats dropped to 84% and she felt lightheaded. The patient was then sent to the emergency room for evaluation. Patient reports that her shortness of breath is worse with minimal exertion and with laying flat. She sleeps on 3 pillows however has been doing this for many years. Her last left heart catheterization showed mild coronary artery disease done in 2016. Patient has no history of heart failure. She denies weight gain, leg swelling, cough, congestion, fever, chills, chest pain. She denies shortness of breath at rest. She stopped smoking 5 years ago. She is not on oxygen at home.  Past Medical History:  Diagnosis Date  . Anemia   . Anxiety    followed by Dr. Janeece Riggers  . Asthma   . 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 Chronic kidney disease, stage II (mild)   . CKD (chronic kidney disease) stage 3, GFR 30-59 ml/min   . COPD (chronic obstructive pulmonary disease) (HCC)   . Depression    followed by Dr. Janeece Riggers  .  Diverticulitis    hospitalized several times  . GERD (gastroesophageal reflux disease)   . Heart murmur   . Hiatal hernia   . Hypercholesteremia   . Hyperlipidemia   . Hypertension   . Leaky heart valve   . Low serum cortisol level (HCC) 06/03/2015  . Murmur   . Overactive bladder   . Panic attacks    followed by Dr. Janeece Riggers    Patient Active Problem List   Diagnosis Date Noted  . Medication monitoring encounter 03/07/2016  . Low serum cortisol level (HCC) 06/03/2015  . Vitamin B12 deficiency 06/03/2015  . Hyperlipidemia LDL goal <70 06/03/2015  . Abnormal weight gain 05/20/2015  . Vitamin D deficiency 05/20/2015  . Breast cancer screening 05/20/2015  . IFG (impaired fasting glucose) 02/09/2015  . Striae 02/09/2015  . Elevated red blood cell count 02/09/2015  . Weight gain, abnormal 02/09/2015  . Unstable angina (HCC) 01/07/2015  . Angina at rest [facility]) 01/06/2015  . Vascular calcification 12/29/2014  . Heart murmur 12/29/2014  . Hx of angina pectoris 12/29/2014  . Facial numbness 12/29/2014  . Right arm numbness 12/29/2014  . Right leg numbness 12/29/2014  . Xanthelasma of eyelid 10/09/2014  . AVM (arteriovenous malformation) 09/16/2014  . Essential hypertension, benign 09/16/2014  . Onycholysis of toenail 09/16/2014  . Vaginal atrophy 09/16/2014  . Bilateral hand pain 09/16/2014  . Chronic kidney disease, stage II (mild)   . Hypercholesteremia   . Sigmoid diverticulitis 09/30/2013    Past Surgical History:  Procedure Laterality Date  .  ABDOMINAL HYSTERECTOMY  2007   Total with BSO  . APPENDECTOMY  2006  . CARDIAC CATHETERIZATION N/A 01/07/2015   Procedure: Left Heart Cath and Coronary Angiography;  Surgeon: Lamar Blinks, MD;  Location: [facility] INVASIVE CV LAB;  Service: Cardiovascular;  Laterality: N/A;  . COLONOSCOPY  2013   Dr Wandalee Ferdinand at [facility]  . SIGMOID RESECTION / RECTOPEXY  10/17/13  . TUBAL LIGATION  1981  . UPPER GI ENDOSCOPY  2013   Dr Wandalee Ferdinand at  [facility]    Prior to Admission medications   Medication Sig Start Date End Date Taking? Authorizing Provider  albuterol (PROVENTIL HFA;VENTOLIN HFA) 108 (90 Base) MCG/ACT inhaler Inhale 2 puffs into the lungs every 4 (four) hours as needed for wheezing or shortness of breath. 05/20/15   Kerman Passey, MD  ALPRAZolam Prudy Feeler) 1 MG tablet Take 1 mg by mouth 4 (four) times daily as needed.  09/02/13   Historical Provider, MD  metoprolol succinate (TOPROL-XL) 50 MG 24 hr tablet Take 1 tablet (50 mg total) by mouth daily. 09/29/15   Kerman Passey, MD  metroNIDAZOLE (FLAGYL) 500 MG tablet Take 1 tablet (500 mg total) by mouth 2 (two) times daily. No alcohol or cold medicines that contain alcohol while on this med 03/07/16 03/14/16  Kerman Passey, MD  montelukast (SINGULAIR) 10 MG tablet TAKE 1 TABLET BY MOUTH EVERY NIGHT AT BEDTIME. 12/17/15   Kerman Passey, MD  Vitamin D, Ergocalciferol, (DRISDOL) 50000 units CAPS capsule Take 1 capsule (50,000 Units total) by mouth every 7 (seven) days. 03/08/16 05/03/16  Kerman Passey, MD    Allergies Cephalexin; Fish allergy; Ivp dye [iodinated diagnostic agents]; Nitrofurantoin; Sulfa antibiotics; Aspirin; Ciprofloxacin; Doxycycline; Other; Oxycodone; Percocet [oxycodone-acetaminophen]; Tequin [gatifloxacin]; Latex; Myrbetriq [mirabegron]; and Premarin [conjugated estrogens]  Family History  Problem Relation Age of Onset  . Cancer Mother     colon  . Kidney disease Mother   . Arthritis Father   . Alcohol abuse Father   . Mental illness Father   . Diverticulitis Father   . Asthma Sister   . Hyperlipidemia Sister   . Hypertension Sister   . Migraines Sister   . Heart disease Brother   . Hyperlipidemia Brother   . Hypertension Brother   . Stroke Brother   . Hypertension Daughter   . Hypertension Son   . Hypertension Sister   . Hypertension Sister   . Hypertension Sister   . Arthritis Sister   . Heart disease Sister   . Hypertension Sister   .  Hypertension Brother   . Hyperlipidemia Brother   . Down syndrome Brother     Social History Social History  Substance Use Topics  . Smoking status: Former Smoker    Packs/day: 2.00    Years: 25.00    Types: Cigarettes    Quit date: 01/10/2011  . Smokeless tobacco: Never Used  . Alcohol use No    Review of Systems  Constitutional: Negative for fever. + lightheadedness Eyes: Negative for visual changes. ENT: Negative for sore throat. Neck: No neck pain  Cardiovascular: Negative for chest pain. Respiratory: + shortness of breath. Gastrointestinal: Negative for abdominal pain, vomiting or diarrhea. Genitourinary: Negative for dysuria. Musculoskeletal: Negative for back pain. Skin: Negative for rash. Neurological: Negative for headaches, weakness or numbness. Psych: No SI or HI  ____________________________________________   PHYSICAL EXAM:  VITAL SIGNS: ED Triage Vitals  Enc Vitals Group     BP 03/14/16 1636 138/76  Pulse Rate 03/14/16 1636 92     Resp 03/14/16 1636 20     Temp 03/14/16 1636 97.7 F (36.5 C)     Temp Source 03/14/16 1636 Oral     SpO2 03/14/16 1636 97 %     Weight 03/14/16 1632 161 lb (73 kg)     Height 03/14/16 1632  (1.473 m)     Head Circumference --      Peak Flow --      Pain Score 03/14/16 1633 0     Pain Loc --      Pain Edu? --      Excl. in GC? --     Constitutional: Alert and oriented. Well appearing and in no apparent distress. HEENT:      Head: Normocephalic and atraumatic.         Eyes: Conjunctivae are normal. Sclera is non-icteric. EOMI. PERRL      Mouth/Throat: Mucous membranes are moist.       Neck: Supple with no signs of meningismus. Cardiovascular: Regular rate and rhythm. No murmurs, gallops, or rubs. 2+ symmetrical distal pulses are present in all extremities. No JVD. Respiratory: Normal respiratory effort. Decreased air movement bilaterally. No wheezes, crackles, or rhonchi.  Gastrointestinal: Soft, non  tender, and non distended with positive bowel sounds. No rebound or guarding. Musculoskeletal: Nontender with normal range of motion in all extremities. No edema, cyanosis, or erythema of extremities. Neurologic: Normal speech and language. Face is symmetric. Moving all extremities. No gross focal neurologic deficits are appreciated. Skin: Skin is warm, dry and intact. No rash noted. Psychiatric: Mood and affect are normal. Speech and behavior are normal.  ____________________________________________   LABS (all labs ordered are listed, but only abnormal results are displayed)  Labs Reviewed  BASIC METABOLIC PANEL - Abnormal; Notable for the following:       Result Value   Chloride 98 (*)    Creatinine, Ser 1.07 (*)    GFR calc non Af Amer 56 (*)    All other components within normal limits  CBC - Abnormal; Notable for the following:    WBC 12.2 (*)    RBC 5.36 (*)    RDW 14.6 (*)    All other components within normal limits  TROPONIN I  URINALYSIS, COMPLETE (UACMP) WITH MICROSCOPIC   ____________________________________________  EKG  ED ECG REPORT I, Nita Sickle, the attending physician, personally viewed and interpreted this ECG.  Normal sinus rhythm, rate of 86, normal intervals, normal axis, no ST elevations or depressions.   ____________________________________________  RADIOLOGY  CXR: Negative ____________________________________________   PROCEDURES  Procedure(s) performed: None Procedures Critical Care performed: yes  CRITICAL CARE Performed by: Nita Sickle  ?  Total critical care time: 35 min  Critical care time was exclusive of separately billable procedures and treating other patients.  Critical care was necessary to treat or prevent imminent or life-threatening deterioration.  Critical care was time spent personally by me on the following activities: development of treatment plan with patient and/or surrogate as well as nursing,  discussions with consultants, evaluation of patient's response to treatment, examination of patient, obtaining history from patient or surrogate, ordering and performing treatments and interventions, ordering and review of laboratory studies, ordering and review of radiographic studies, pulse oximetry and re-evaluation of patient's condition.  ____________________________________________   INITIAL IMPRESSION / ASSESSMENT AND PLAN / ED COURSE  61 y.o. female with a history of COPD, chronic kidney disease, coronary artery disease, anemia, hypertension, hyperlipidemia who presents  for evaluation of hypoxia and dizziness.Patient has had wheezing and shortness of breath over a month and worse in the last week. At this time she has decreased air movement but normal work of breathing, satting 95-96% on room air, she has no crackles or wheezing, no pitting edema, no JVD. EKG with no evidence of ischemia. We'll give 3 duo nebs, get a chest x-ray, basic blood work and reassess. We'll then get ambulatory sats.  Clinical Course as of Mar 15 1998  Tue Mar 14, 2016  1956 Patient now has better air movement however remains hypoxic on room air at 88%. We'll admit to the hospitalist service.  [CV]    Clinical Course User Index [CV] Nita Sickle, MD    Pertinent labs & imaging results that were available during my care of the patient were reviewed by me and considered in my medical decision making (see chart for details).    ____________________________________________   FINAL CLINICAL IMPRESSION(S) / ED DIAGNOSES  Final diagnoses:  Acute respiratory failure with hypoxia (HCC)  COPD exacerbation (HCC)      NEW MEDICATIONS STARTED DURING THIS VISIT:  New Prescriptions   No medications on file     Note:  This document was prepared using Dragon voice recognition software and may include unintentional dictation errors.    Nita Sickle, MD 03/14/16 2000

## 2016-03-14 NOTE — Progress Notes (Signed)
BP 120/70   Pulse 97   Temp 98.1 F (36.7 C)   Wt 162 lb 11.2 oz (73.8 kg)   SpO2 90%   BMI 34.00 kg/m   Pulse ox 87% at check-in Pulse ox 94% on 2 liters/minute via nasal cannula  Subjective:    Patient ID: Kylie [name], female    DOB: 1955/09/17, 61 y.o.   MRN: [id number]  HPI: Kylie [name] is a 61 y.o. female  Chief Complaint  Patient presents with  . Follow-up    f/u on labs    She is here for f/u of the labs; however, my CMA got my attention right away when her check-in pulse ox was only 87% She has COPD and has had to be hospitalized for COPD exacerbations in the past Her lungs were hurting when she breathes No exposures to chemicals or fumes SHOB; can't go far without giving out of breath "I can't do anything", fold clothes, small tasks, all for the last week Quit smoking about five years ago Her grandmother died from COPD A sister also has COPD She took 2 laps of the office walking, dropped to 84% and she got dizzy  ---------------------------------------------- CXR Mar 06, 2016 CLINICAL DATA:  Difficulty breathing and cough  EXAM: CHEST  2 VIEW  COMPARISON:  12/29/2011  FINDINGS: The heart size and mediastinal contours are within normal limits. Both lungs are clear. The visualized skeletal structures are unremarkable.  IMPRESSION: No active cardiopulmonary disease.   Electronically Signed   By: Alcide Clever M.D.   On: 03/06/2016 13:01 ---------------------------------------------------  Depression screen [facility] 2/9 03/14/2016 03/06/2016 06/03/2015 05/20/2015 05/20/2015  Kylie [name] 0 0 0 0 0  Down, Depressed, Hopeless 1 0 0 0 0  PHQ - 2 Score 1 0 0 0 0   Relevant past medical, surgical, family and social history reviewed Past Medical History:  Diagnosis Date  . Anemia   . Anxiety    followed by Dr. Janeece Riggers  . Asthma   . 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 Chronic kidney disease, stage II (mild)   .  CKD (chronic kidney disease) stage 3, GFR 30-59 ml/min   . COPD (chronic obstructive pulmonary disease) (HCC)   . Depression    followed by Dr. Janeece Riggers  . Diverticulitis    hospitalized several times  . GERD (gastroesophageal reflux disease)   . Heart murmur   . Hiatal hernia   . Hypercholesteremia   . Hyperlipidemia   . Hypertension   . Leaky heart valve   . Low serum cortisol level (HCC) 06/03/2015  . Murmur   . Overactive bladder   . Panic attacks    followed by Dr. Janeece Riggers   Past Surgical History:  Procedure Laterality Date  . ABDOMINAL HYSTERECTOMY  2007   Total with BSO  . APPENDECTOMY  2006  . CARDIAC CATHETERIZATION N/A 01/07/2015   Procedure: Left Heart Cath and Coronary Angiography;  Surgeon: Lamar Blinks, MD;  Location: [facility] INVASIVE CV LAB;  Service: Cardiovascular;  Laterality: N/A;  . COLONOSCOPY  2013   Dr Wandalee Ferdinand at [facility]  . SIGMOID RESECTION / RECTOPEXY  10/17/13  . TUBAL LIGATION  1981  . UPPER GI ENDOSCOPY  2013   Dr Wandalee Ferdinand at [facility]   Family History  Problem Relation Age of Onset  . Cancer Mother     colon  . Kidney disease Mother   . Arthritis Father   .  Alcohol abuse Father   . Mental illness Father   . Diverticulitis Father   . Asthma Sister   . Hyperlipidemia Sister   . Hypertension Sister   . Migraines Sister   . Heart disease Brother   . Hyperlipidemia Brother   . Hypertension Brother   . Stroke Brother   . Hypertension Daughter   . Hypertension Son   . Hypertension Sister   . Hypertension Sister   . Hypertension Sister   . Arthritis Sister   . Heart disease Sister   . Hypertension Sister   . Hypertension Brother   . Hyperlipidemia Brother   . Down syndrome Brother    Social History  Substance Use Topics  . Smoking status: Former Smoker    Packs/day: 2.00    Years: 25.00    Types: Cigarettes    Quit date: 01/10/2011  . Smokeless tobacco: Never Used  . Alcohol use No   Interim medical history since last visit  reviewed. Allergies and medications reviewed  Review of Systems Per HPI unless specifically indicated above     Objective:    BP 120/70   Pulse 97   Temp 98.1 F (36.7 C)   Wt 162 lb 11.2 oz (73.8 kg)   SpO2 90%   BMI 34.00 kg/m   Wt Readings from Last 3 Encounters:  03/15/16 163 lb 12.8 oz (74.3 kg)  03/14/16 162 lb 11.2 oz (73.8 kg)  03/06/16 159 lb 8 oz (72.3 kg)    Physical Exam  Constitutional: She appears well-developed and well-nourished.  Female seated in wheelchair; appears weak; nontoxic; not cyanotic  Eyes: No scleral icterus.  Cardiovascular: Normal rate and regular rhythm.   Pulmonary/Chest: No accessory muscle usage. No respiratory distress. She has Kylie breath sounds. She has no rhonchi.  Abdominal: She exhibits no distension.  Skin: She is not diaphoretic. No pallor.  Not cyanotic  Psychiatric: Her mood appears anxious.      Assessment & Plan:   Problem List Items Addressed This Visit      Respiratory   COPD exacerbation (HCC)    With low oxygen saturation, dyspnea, weakness; sending patient now to the hospital for probable admission       Other Visit Diagnoses    Hypoxia    -  Primary   likely related to COPD exacerbation, but have to r/o PE, CHF; sending to ER now       Follow up plan: No Follow-up on file.  An after-visit summary was printed and given to the patient at check-out.  Please see the patient instructions which may contain other information and recommendations beyond what is mentioned above in the assessment and plan.  No orders of the defined types were placed in this encounter.   No orders of the defined types were placed in this encounter.

## 2016-03-14 NOTE — ED Notes (Signed)
Sandwich tray given at this time 

## 2016-03-14 NOTE — ED Triage Notes (Signed)
Pt was seen at MD and reports oxygen sat was 88% on RA.  Pt reports she was ambulated at MD office and it was 92% with oxygen. Pt placed on 2L via [place] upon arrival.

## 2016-03-15 ENCOUNTER — Telehealth: Payer: Self-pay

## 2016-03-15 DIAGNOSIS — J449 Chronic obstructive pulmonary disease, unspecified: Secondary | ICD-10-CM | POA: Diagnosis present

## 2016-03-15 LAB — URINALYSIS, COMPLETE (UACMP) WITH MICROSCOPIC
BILIRUBIN URINE: NEGATIVE
Bacteria, UA: NONE SEEN
GLUCOSE, UA: 150 mg/dL — AB
HGB URINE DIPSTICK: NEGATIVE
KETONES UR: 5 mg/dL — AB
NITRITE: NEGATIVE
PH: 5 (ref 5.0–8.0)
PROTEIN: NEGATIVE mg/dL
RBC / HPF: NONE SEEN RBC/hpf (ref 0–5)
Specific Gravity, Urine: 1.023 (ref 1.005–1.030)

## 2016-03-15 LAB — CBC
HCT: 45.1 % (ref 35.0–47.0)
Hemoglobin: 14.6 g/dL (ref 12.0–16.0)
MCH: 28.4 pg (ref 26.0–34.0)
MCHC: 32.5 g/dL (ref 32.0–36.0)
MCV: 87.6 fL (ref 80.0–100.0)
Platelets: 269 10*3/uL (ref 150–440)
RBC: 5.15 MIL/uL (ref 3.80–5.20)
RDW: 15.5 % — ABNORMAL HIGH (ref 11.5–14.5)
WBC: 11.7 10*3/uL — ABNORMAL HIGH (ref 3.6–11.0)

## 2016-03-15 LAB — BASIC METABOLIC PANEL
Anion gap: 12 (ref 5–15)
BUN: 21 mg/dL — AB (ref 6–20)
CALCIUM: 9.2 mg/dL (ref 8.9–10.3)
CO2: 25 mmol/L (ref 22–32)
Chloride: 101 mmol/L (ref 101–111)
Creatinine, Ser: 1.09 mg/dL — ABNORMAL HIGH (ref 0.44–1.00)
GFR calc Af Amer: >60 mL/min (ref >60)
GFR, EST NON AFRICAN AMERICAN: 55 mL/min — AB (ref >60)
GLUCOSE: 239 mg/dL — AB (ref 65–99)
Potassium: 4.3 mmol/L (ref 3.5–5.1)
Sodium: 138 mmol/L (ref 135–145)

## 2016-03-15 MED ORDER — PREDNISONE 50 MG PO TABS: 50.0000 mg | ORAL_TABLET | Freq: Every day | ORAL | 0 refills | Status: AC

## 2016-03-15 NOTE — Progress Notes (Signed)
Oxygen and walker delivered. Reviewed discharge paperwork, along with last dose given and new script. IS given and instructed. IV removed with cath intact. Husband at bedside during discharge. Allowed time for questions.

## 2016-03-15 NOTE — Progress Notes (Signed)
SATURATION QUALIFICATIONS: (This note is used to comply with regulatory documentation for home oxygen)  Patient Saturations on Room Air at Rest = 88%  Patient Saturations on Room Air while Ambulating = 87%  Patient Saturations on 2 Liters of oxygen while Ambulating =91%  Please briefly explain why patient needs home oxygen: With activity patient oxygen level goes down, gets short of breath, and need frequent breaks

## 2016-03-15 NOTE — Care Management Note (Signed)
Case Management Note  Patient Details  Name: Kylie HoseBarbara A [name] MRN: [id number] Date of Birth: 04/18/1955  Subjective/Objective:                  Spoke with patient by phone after seeing that she has a discharge order in place. She states she is currently wearing O2 which she does not have at home. She states she has a supportive family (sisters) and lives with her supportive husband. She understands that nursing is going to walk her and assess need for home O2. Her PCP is Dr. Sherie DonLada and she denies difficult getting to appointments or obtaining her medications.  Action/Plan:   RNCM will follow. [phone]  Expected Discharge Date:  03/15/16               Expected Discharge Plan:     In-House Referral:     Discharge planning Services  CM Consult  Post Acute Care Choice:  Durable Medical Equipment Choice offered to:  Patient  DME Arranged:  Oxygen DME Agency:  [facility]  [facility] Agency:     Status of Service:  In process, will continue to follow  If discussed at Long Length of Stay Meetings, dates discussed:    Additional Comments:  [name], RN 03/15/2016, 8:24 AM

## 2016-03-15 NOTE — Discharge Summary (Signed)
[facility] - [place] [facility]   PATIENT NAME: Kylie [name]    MR#:  [id number]  DATE OF BIRTH:  04/29/1955  DATE OF ADMISSION:  03/14/2016 ADMITTING PHYSICIAN: Milagros LollSrikar Sudini, MD  DATE OF DISCHARGE: 03/15/2016  PRIMARY CARE PHYSICIAN: Baruch GoutyMelinda Lada, MD    ADMISSION DIAGNOSIS:  COPD exacerbation (HCC) [J44.1] Acute respiratory failure with hypoxia (HCC) [J96.01]  DISCHARGE DIAGNOSIS:  Active Problems:   COPD exacerbation (HCC)   SECONDARY DIAGNOSIS:   Past Medical History:  Diagnosis Date  . Anemia   . Anxiety    followed by Dr. Janeece RiggersSu  . Asthma   . 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 Chronic kidney disease, stage II (mild)   . CKD (chronic kidney disease) stage 3, GFR 30-59 ml/min   . COPD (chronic obstructive pulmonary disease) (HCC)   . Depression    followed by Dr. Janeece RiggersSu  . Diverticulitis    hospitalized several times  . GERD (gastroesophageal reflux disease)   . Heart murmur   . Hiatal hernia   . Hypercholesteremia   . Hyperlipidemia   . Hypertension   . Leaky heart valve   . Low serum cortisol level (HCC) 06/03/2015  . Murmur   . Overactive bladder   . Panic attacks    followed by Dr. Tamera PuntSu    HOSPITAL COURSE:   This 61-year-old female with history of COPD not on oxygen who presents with acute hypoxic respiratory failure.  1. Acute hypoxic respiratory failure due to acute COPD exacerbation: Patient symptoms have improved with IV steroids. She will continue inhalers and nebulizers. She will have outpatient follow-up with pulmonary. She was assessed for oxygen prior to discharge.  2. Essential hypertension: Continue metoprolol  3. Anxiety: Continue Xanax   DISCHARGE CONDITIONS AND DIET:   Stable for discharge on heart healthy diet  CONSULTS OBTAINED:    DRUG ALLERGIES:   Allergies  Allergen Reactions  . Cephalexin Rash  . Fish Allergy Hives  . Ivp Dye [Iodinated Diagnostic Agents] Other (See  Comments) and Anaphylaxis    Blood pressure drops very low eyes roll back in the back of her head cant breathe Shock, low BP  . Nitrofurantoin Rash  . Sulfa Antibiotics Hives and Rash  . Aspirin Other (See Comments)    Stomach issues  . Ciprofloxacin Hives  . Doxycycline   . Other Other (See Comments)    Myobetria/ dizzness  . Oxycodone Other (See Comments)    Other Reaction: Not Assessed  . Percocet [Oxycodone-Acetaminophen] Other (See Comments)    Mental reaction  . Tequin [Gatifloxacin] Hives  . Latex Rash  . Myrbetriq [Mirabegron] Rash  . Premarin [Conjugated Estrogens] Rash    DISCHARGE MEDICATIONS:   Current Discharge Medication List    START taking these medications   Details  predniSONE (DELTASONE) 50 MG tablet Take 1 tablet (50 mg total) by mouth daily with breakfast. Qty: 5 tablet, Refills: 0      CONTINUE these medications which have NOT CHANGED   Details  albuterol (PROVENTIL HFA;VENTOLIN HFA) 108 (90 Base) MCG/ACT inhaler Inhale 2 puffs into the lungs every 4 (four) hours as needed for wheezing or shortness of breath. Qty: 1 Inhaler, Refills: 1    ALPRAZolam (XANAX) 1 MG tablet Take 1 mg by mouth 4 (four) times daily as needed.     calcium carbonate (TUMS - DOSED IN MG ELEMENTAL CALCIUM) 500 MG chewable tablet Chew 1 tablet by mouth daily.  metoprolol succinate (TOPROL-XL) 50 MG 24 hr tablet Take 1 tablet (50 mg total) by mouth daily. Qty: 30 tablet, Refills: 11    montelukast (SINGULAIR) 10 MG tablet TAKE 1 TABLET BY MOUTH EVERY NIGHT AT BEDTIME. Qty: 30 tablet, Refills: 6    Vitamin D, Ergocalciferol, (DRISDOL) 50000 units CAPS capsule Take 1 capsule (50,000 Units total) by mouth every 7 (seven) days. Qty: 4 capsule, Refills: 1      STOP taking these medications     metroNIDAZOLE (FLAGYL) 500 MG tablet           Today   CHIEF COMPLAINT:  Has had dizziness with hypoxia no chest pain  Sob better and no wheezing today   VITAL SIGNS:   Blood pressure 122/68, pulse 98, temperature 98.6 F (37 C), temperature source Oral, resp. rate 20, height 4\' 10"  (1.473 m), weight 74.3 kg (163 lb 12.8 oz), SpO2 96 %.   REVIEW OF SYSTEMS:  Review of Systems  Constitutional: Negative.  Negative for chills, fever and malaise/fatigue.  HENT: Negative.  Negative for ear discharge, ear pain, hearing loss, nosebleeds and sore throat.   Eyes: Negative.  Negative for blurred vision and pain.  Respiratory: Negative.  Negative for cough, hemoptysis, shortness of breath and wheezing.   Cardiovascular: Negative.  Negative for chest pain, palpitations and leg swelling.  Gastrointestinal: Negative.  Negative for abdominal pain, blood in stool, diarrhea, nausea and vomiting.  Genitourinary: Negative.  Negative for dysuria.  Musculoskeletal: Negative.  Negative for back pain.  Skin: Negative.   Neurological: Negative for dizziness, tremors, speech change, focal weakness, seizures and headaches.  Endo/Heme/Allergies: Negative.  Does not bruise/bleed easily.  Psychiatric/Behavioral: Negative.  Negative for depression, hallucinations and suicidal ideas.     PHYSICAL EXAMINATION:  GENERAL:  61 y.o.-year-old patient lying in the bed with no acute distress.  NECK:  Supple, no jugular venous distention. No thyroid enlargement, no tenderness.  LUNGS: Normal breath sounds bilaterally, no wheezing, rales,rhonchi  No use of accessory muscles of respiration.  CARDIOVASCULAR: S1, S2 normal. No murmurs, rubs, or gallops.  ABDOMEN: Soft, non-tender, non-distended. Bowel sounds present. No organomegaly or mass.  EXTREMITIES: No pedal edema, cyanosis, or clubbing.  PSYCHIATRIC: The patient is alert and oriented x 3.  SKIN: No obvious rash, lesion, or ulcer.   DATA REVIEW:   CBC  Recent Labs Lab 03/15/16 0417  WBC 11.7*  HGB 14.6  HCT 45.1  PLT 269    Chemistries   Recent Labs Lab 03/15/16 0417  NA 138  K 4.3  CL 101  CO2 25  GLUCOSE 239*   BUN 21*  CREATININE 1.09*  CALCIUM 9.2    Cardiac Enzymes  Recent Labs Lab 03/14/16 1634  TROPONINI <0.03    Microbiology Results  @MICRORSLT48 @  RADIOLOGY:  Dg Chest 2 View  Result Date: 03/14/2016 CLINICAL DATA:  Shortness of breath, hypoxia EXAM: CHEST  2 VIEW COMPARISON:  03/06/2016 FINDINGS: Cardiomediastinal silhouette is stable. No infiltrate or pleural effusion. No pulmonary edema. Osteopenia and mild degenerative changes mid thoracic spine. IMPRESSION: No active cardiopulmonary disease. Electronically Signed   By: Natasha Mead M.D.   On: 03/14/2016 18:16      Current Discharge Medication List    START taking these medications   Details  predniSONE (DELTASONE) 50 MG tablet Take 1 tablet (50 mg total) by mouth daily with breakfast. Qty: 5 tablet, Refills: 0      CONTINUE these medications which have NOT CHANGED   Details  albuterol (  PROVENTIL HFA;VENTOLIN HFA) 108 (90 Base) MCG/ACT inhaler Inhale 2 puffs into the lungs every 4 (four) hours as needed for wheezing or shortness of breath. Qty: 1 Inhaler, Refills: 1    ALPRAZolam (XANAX) 1 MG tablet Take 1 mg by mouth 4 (four) times daily as needed.     calcium carbonate (TUMS - DOSED IN MG ELEMENTAL CALCIUM) 500 MG chewable tablet Chew 1 tablet by mouth daily.    metoprolol succinate (TOPROL-XL) 50 MG 24 hr tablet Take 1 tablet (50 mg total) by mouth daily. Qty: 30 tablet, Refills: 11    montelukast (SINGULAIR) 10 MG tablet TAKE 1 TABLET BY MOUTH EVERY NIGHT AT BEDTIME. Qty: 30 tablet, Refills: 6    Vitamin D, Ergocalciferol, (DRISDOL) 50000 units CAPS capsule Take 1 capsule (50,000 Units total) by mouth every 7 (seven) days. Qty: 4 capsule, Refills: 1      STOP taking these medications     metroNIDAZOLE (FLAGYL) 500 MG tablet           Management plans discussed with the patient and she is in agreement. Stable for discharge   Patient should follow up with pcp  CODE STATUS:     Code Status Orders         Start     Ordered   03/14/16 2040  Full code  Continuous     03/14/16 2040    Code Status History    Date Active Date Inactive Code Status Order ID Comments User Context   01/07/2015  9:33 AM 01/07/2015  1:50 PM Full Code [id number]  Lamar Blinks, MD Inpatient      TOTAL TIME TAKING CARE OF THIS PATIENT: 37 minutes.    Note: This dictation was prepared with Dragon dictation along with smaller phrase technology. Any transcriptional errors that result from this process are unintentional.  Elma Shands M.D on 03/15/2016 at 8:18 AM  Between 7am to 6pm - Pager - [phone] After 6pm go to [email or web address] - Social research officer, government  [facility]  Office  [phone]  CC: Primary care physician; Baruch Gouty, MD

## 2016-03-15 NOTE — Telephone Encounter (Signed)
[facility] calling asking for a 2w fu with one of our providers  I have scheduled them for April 16th with Dr Belia HemanKasa  Pt is on waitlist Is there any way we can see them sooner Please advise

## 2016-03-15 NOTE — Telephone Encounter (Signed)
You can put the pt in on 03/28/16 @ 12pm for [place] but it needs to be listed as a consult due to we have never seen the patient.

## 2016-03-15 NOTE — Care Management Note (Signed)
Case Management Note  Patient Details  Name: Kylie [name] MRN: [id number] Date of Birth: [birth year]  Subjective/Objective:                  Met with patient and her husband to discuss discharge planning. She plans to return home today with O2 and a new rolling walker which will be delivered from [facility]. She would also like to use [facility] for home health. She had lost her health insurance due to address change per her/husband which has been reinstarted to start tomorrow.  Action/Plan:   Home health list provided- referral to [facility]. Rolling walker and O2 delivered. Telephone numbers shared with husband for [facility] financial services. No other RNCM needs. Case closed.   Expected Discharge Date:  03/15/16               Expected Discharge Plan:     In-House Referral:     Discharge planning Services  CM Consult  Post Acute Care Choice:  Durable Medical Equipment, Home Health Choice offered to:  Patient  DME Arranged:  Oxygen, Walker rolling DME Agency:  [place]:  PT, RN [facility] Agency:  [facility]  Status of Service:  Completed, signed off  If discussed at [place] of Stay Meetings, dates discussed:    Additional Comments:  Marshell Garfinkel, RN 03/15/2016, 12:11 PM

## 2016-03-15 NOTE — Evaluation (Signed)
Physical Therapy Evaluation Patient Details Name: Kylie [name] MRN: [id number] DOB: 1955-10-22 Today's Date: 03/15/2016   History of Present Illness  Pt presented to ED with c/o SOB and was found to have COPD exacerbation. Pt placed on 3L of O2, as SaO2 on room air was 87% upon admission. Pt currently on 2L of O2.  Clinical Impression  Pt is a pleasant 61 y/o female admitted to hospital with SOB 2/2 COPD exacerbation. Please see PMH for details. Pt required min guard to S during STS txfs and ambulation 2/2 weakness, impaired balance, decr. Endurance, and lightheadedness. Pt was MOD I level for all bed mobility. Pt's SpO2 on 2L of O2 during session, including gait training, remained above 90%. Pt required frequent standing rest breaks during amb. With RW, and did not wish to trial amb. Without SpO2 due to lightheadedness. Pt required cues for sequencing and incr. Stride length during gait training with RW.  Pt would benefit from skilled PT to address above deficits and promote optimal return to PLOF. PT is recommending HHPT and RW for this pt upon d/c from hospital.       Follow Up Recommendations Home health PT    Equipment Recommendations  Rolling walker with 5" wheels    Recommendations for Other Services       Precautions / Restrictions Precautions Precautions: Fall (based on slow gait speed and lightheadedness) Restrictions Weight Bearing Restrictions: No      Mobility  Bed Mobility Overal bed mobility: Modified Independent             General bed mobility comments: HOB slightly elevated with pt utilizing bed rails to perform supine<>sit txfs. Pt demo safe and proper technique.  Transfers Overall transfer level: Needs assistance Equipment used: Rolling walker (2 wheeled) Transfers: Sit to/from Stand Sit to Stand: Supervision         General transfer comment: Pt required cues for sequencing and hand placement during STS txfs with RW to ensure  safety.  Ambulation/Gait Ambulation/Gait assistance: Min guard;Supervision Ambulation Distance (Feet): 200 Feet Assistive device: Rolling walker (2 wheeled) Gait Pattern/deviations: Step-through pattern;Decreased stride length;Wide base of support Gait velocity: Pt unable to obtain gait speed, as pt required frequent rest breaks, but pt noted amb. in slow manner.   General Gait Details: Prior to amb., PT had pt march x10 steps/LE to ensure safety during amb. Pt then amb. 200' with 2L of O2 and RW, with cues for sequencing with RW. Pt's SpO2 remained between 90-94% during amb, and HR in the 120's, resting SpO2 was 92% and HR ranged 115-122bpm. Pt required frequent (5 total) standing rest breaks 2/2 fatigue and SOB during amb., and was did not wish to trial amb. with out O2 due to lightheadedness.  Pt's SpO2 on 2L after amb. >92% and HR 123bpm.  Stairs            Wheelchair Mobility    Modified Rankin (Stroke Patients Only)       Balance Overall balance assessment: Needs assistance Sitting-balance support: Feet unsupported;Bilateral upper extremity supported       Standing balance support: Bilateral upper extremity supported   Standing balance comment: Pt required BUE support on RW to maintain balance during session, this likely due to fatigue and decr. endurance.                              Pertinent Vitals/Pain Pain Assessment: No/denies pain    Home  Living Family/patient expects to be discharged to:: Private residence Living Arrangements: Spouse/significant other Available Help at Discharge: Family (husband and sisters) Type of Home: House Home Access: Stairs to enter Entrance Stairs-Rails: Can reach both Entrance Stairs-Number of Steps: 3 Home Layout: One level Home Equipment: Cane - single point Additional Comments: pt is not sure if cane is single point or tripod/quad cane    Prior Function Level of Independence: Independent         Comments:  Pt IND prior to hospitalization, but stated she has been less active 2/2 SOB and hx of R foot fractures (last fx over 2 years ago per pt report).     Hand Dominance        Extremity/Trunk Assessment   Upper Extremity Assessment Upper Extremity Assessment: Overall WFL for tasks assessed    Lower Extremity Assessment Lower Extremity Assessment: RLE deficits/detail;LLE deficits/detail RLE Deficits / Details: BLE MMT grossly 4/5, but PT unable to apply maximal resistance 2/2 pt reporting LLE ant/medial thigh pain s/p femoral stent placement from "years ago". B UE/LE AROM WFL. LLE Deficits / Details: see RLE details       Communication   Communication: No difficulties  Cognition Arousal/Alertness: Awake/alert Behavior During Therapy: WFL for tasks assessed/performed Overall Cognitive Status: Within Functional Limits for tasks assessed                      General Comments      Exercises     Assessment/Plan    PT Assessment Patient needs continued PT services  PT Problem List Decreased strength;Decreased balance;Decreased mobility;Decreased knowledge of use of DME;Obesity       PT Treatment Interventions Gait training    PT Goals (Current goals can be found in the Care Plan section)  Acute Rehab PT Goals Patient Stated Goal: To walk and not be experience SOB. PT Goal Formulation: With patient Time For Goal Achievement: 03/29/16 Potential to Achieve Goals: Good    Frequency Min 2X/week   Barriers to discharge  (none)      Co-evaluation               End of Session Equipment Utilized During Treatment: Gait belt;Oxygen Activity Tolerance: Patient tolerated treatment well;Patient limited by fatigue Patient left: in bed;with call bell/phone within reach;with bed alarm set;with family/visitor present Nurse Communication: Mobility status;Other (comment) (amb. with 2L O2, PT also spoke with case worker) PT Visit Diagnosis: Unsteadiness on feet  (R26.81);Other abnormalities of gait and mobility (R26.89);Muscle weakness (generalized) (M62.81)    Functional Assessment Tool Used: AM-PAC 6 Clicks Basic Mobility;Clinical judgement Functional Limitation: Mobility: Walking and moving around Mobility: Walking and Moving Around Current Status ([id number]([id number]): At least 20 percent but less than 40 percent impaired, limited or restricted Mobility: Walking and Moving Around Goal Status [phone](G8979): At least 1 percent but less than 20 percent impaired, limited or restricted    Time: 0851-0927 PT Time Calculation (min) (ACUTE ONLY): 36 min   Charges:   PT Evaluation $PT Eval Low Complexity: 1 Procedure PT Treatments $Gait Training: 8-22 mins   PT G Codes:   PT G-Codes **NOT FOR INPATIENT CLASS** Functional Assessment Tool Used: AM-PAC 6 Clicks Basic Mobility;Clinical judgement Functional Limitation: Mobility: Walking and moving around Mobility: Walking and Moving Around Current Status ([id number]([id number]): At least 20 percent but less than 40 percent impaired, limited or restricted Mobility: Walking and Moving Around Goal Status [phone](G8979): At least 1 percent but less than 20 percent impaired, limited or restricted  Dyon Rotert L 03/15/2016, 10:00 AM  Zerita Boers, PT,DPT 03/15/16 10:05 AM

## 2016-03-15 NOTE — Progress Notes (Signed)
Pt is alert and oriented. Received breathing treatments during the night. Shortness of breath on exertion. Remaining on 2L of acute oxygen.

## 2016-03-16 LAB — HIV ANTIBODY (ROUTINE TESTING W REFLEX): HIV SCREEN 4TH GENERATION: NONREACTIVE

## 2016-03-19 NOTE — Assessment & Plan Note (Signed)
With low oxygen saturation, dyspnea, weakness; sending patient now to the hospital for probable admission

## 2016-03-22 ENCOUNTER — Ambulatory Visit: Payer: BLUE CROSS/BLUE SHIELD | Admitting: Family Medicine

## 2016-03-27 ENCOUNTER — Encounter: Payer: Self-pay | Admitting: Family Medicine

## 2016-03-27 ENCOUNTER — Ambulatory Visit (INDEPENDENT_AMBULATORY_CARE_PROVIDER_SITE_OTHER): Payer: Self-pay | Admitting: Family Medicine

## 2016-03-27 DIAGNOSIS — M8580 Other specified disorders of bone density and structure, unspecified site: Secondary | ICD-10-CM

## 2016-03-27 DIAGNOSIS — E559 Vitamin D deficiency, unspecified: Secondary | ICD-10-CM

## 2016-03-27 DIAGNOSIS — R7301 Impaired fasting glucose: Secondary | ICD-10-CM

## 2016-03-27 DIAGNOSIS — J441 Chronic obstructive pulmonary disease with (acute) exacerbation: Secondary | ICD-10-CM

## 2016-03-27 HISTORY — DX: Other specified disorders of bone density and structure, unspecified site: M85.80

## 2016-03-27 MED ORDER — ALBUTEROL SULFATE (2.5 MG/3ML) 0.083% IN NEBU: 2.5000 mg | INHALATION_SOLUTION | Freq: Four times a day (QID) | RESPIRATORY_TRACT | 1 refills | Status: AC | PRN

## 2016-03-27 NOTE — Progress Notes (Signed)
BP 122/68   Pulse (!) 106   Temp 98.7 F (37.1 C) (Oral)   Resp 16   Wt 160 lb 12.8 oz (72.9 kg)   SpO2 96%   BMI 33.61 kg/m    Subjective:    Patient ID: Kylie [name], female    DOB: [birth year], 61 y.o.   MRN: [id number]  HPI: Kylie [name] is a 61 y.o. female  Chief Complaint  Patient presents with  . Hospitalization Follow-up  . COPD   She was hospitalized for COPD She was admitted on March 14, 2016 and stayed overnight Chart says she was treated with IV steroids and then oral prednisone She got nauseated and dizzy with the oral steroids, did not finish because of side effects  Her glucose was 88 on the day of admission and went to 239 with IV steroids; last A1c 6.3  She is now wearing supplemental oxygen, on 2 liters/minutes She is going to see the pulmonologist tomorrow, Dr. 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 No infiltrate or pleural effusion. No pulmonary edema. Osteopenia and mild degenerative changes mid thoracic spine.  IMPRESSION: No active cardiopulmonary disease.   Electronically Signed   By: Natasha Mead M.D.   On: 03/14/2016 18:16   Depression screen [facility] 2/9 03/27/2016 03/27/2016 03/14/2016 03/06/2016 06/03/2015  Decreased Interest 0 0 0 0 0  Down, Depressed, Hopeless 1 0 1 0 0  PHQ - 2 Score 1 0 1 0 0   Relevant past medical, surgical, family and social history reviewed Past Medical History:  Diagnosis Date  . Anemia     . Anxiety    followed by Dr. Janeece Riggers  . Asthma   . 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 Chronic kidney disease, stage II (mild)   . CKD (chronic kidney disease) stage 3, GFR 30-59 ml/min   . COPD (chronic obstructive pulmonary disease) (HCC)   . Depression    followed by Dr. Janeece Riggers  . Diverticulitis    hospitalized several times  . GERD (gastroesophageal reflux disease)   . Heart murmur   . Hiatal hernia   . Hypercholesteremia   . Hyperlipidemia   . Hypertension   . Leaky heart valve   . Low serum cortisol level (HCC) 06/03/2015  . Murmur   . Osteopenia determined by x-ray 03/27/2016  . Overactive bladder   . Panic attacks    followed by Dr. Janeece Riggers   Past Surgical History:  Procedure Laterality Date  . ABDOMINAL HYSTERECTOMY  2007   Total with BSO  . APPENDECTOMY  2006  . CARDIAC CATHETERIZATION N/A 01/07/2015   Procedure: Left Heart Cath and Coronary Angiography;  Surgeon: Lamar Blinks, MD;  Location: [facility] INVASIVE CV LAB;  Service: Cardiovascular;  Laterality: N/A;  . COLONOSCOPY  2013   Dr Wandalee Ferdinand at [facility] GI  . SIGMOID RESECTION / RECTOPEXY  10/17/13  . TUBAL LIGATION  42  . UPPER GI ENDOSCOPY  2013   Dr Wandalee Ferdinand at [facility]   Family History  Problem Relation Age of Onset  . Cancer Mother     colon  . Kidney disease Mother   . Arthritis Father   . Alcohol abuse Father   . Mental illness Father   . Diverticulitis Father   . Asthma Sister   . Hyperlipidemia Sister   . Hypertension Sister   . Migraines Sister   . Heart disease Brother   . Hyperlipidemia Brother   . Hypertension Brother   . Stroke Brother   . Hypertension Daughter   . Hypertension Son   . Hypertension Sister   . Hypertension Sister   . Hypertension Sister   . Arthritis Sister   . Heart disease Sister   . Hypertension Sister   . Hypertension Brother   . Hyperlipidemia Brother   . Down syndrome Brother    Social History  Substance Use Topics  . Smoking  status: Former Smoker    Packs/day: 2.00    Years: 25.00    Types: Cigarettes    Quit date: 01/10/2011  . Smokeless tobacco: Never Used  . Alcohol use No   Interim medical history since last visit reviewed. Allergies and medications reviewed  Review of Systems Per HPI unless specifically indicated above     Objective:    BP 122/68   Pulse (!) 106   Temp 98.7 F (37.1 C) (Oral)   Resp 16   Wt 160 lb 12.8 oz (72.9 kg)   SpO2 96%   BMI 33.61 kg/m   Wt Readings from Last 3 Encounters:  03/28/16 164 lb (74.4 kg)  03/27/16 160 lb 12.8 oz (72.9 kg)  03/15/16 163 lb 12.8 oz (74.3 kg)    Physical Exam  Constitutional: She appears well-developed and well-nourished.  Female seated in wheelchair; appears weak; nontoxic; not cyanotic  Eyes: No scleral icterus.  Cardiovascular: Regular rhythm.  Tachycardia present.   Pulmonary/Chest: Effort normal and breath sounds normal. No accessory muscle usage. No respiratory distress. She has no decreased breath sounds. She has no wheezes. She has no rhonchi.  Abdominal: She exhibits no distension.  Skin: She is not diaphoretic. No pallor.  Not cyanotic  Psychiatric: Her mood appears not anxious. She does not exhibit a depressed mood.   Results for orders placed or performed during the hospital encounter of 03/14/16  Basic metabolic panel  Result Value Ref Range   Sodium 138 135 - 145 mmol/L   Potassium 4.0 3.5 - 5.1 mmol/L   Chloride 98 (L) 101 - 111 mmol/L   CO2 32 22 - 32 mmol/L   Glucose, Bld 88 65 - 99 mg/dL   BUN 17 6 - 20 mg/dL   Creatinine, Ser 4.09 (H) 0.44 - 1.00 mg/dL   Calcium 9.8 8.9 - 81.1 mg/dL   GFR calc non Af Amer 56 (L) >60 mL/min   GFR calc Af Amer >60 >60 mL/min   Anion gap 8 5 - 15  CBC  Result Value Ref Range   WBC 12.2 (H) 3.6 - 11.0 K/uL   RBC 5.36 (H) 3.80 - 5.20 MIL/uL   Hemoglobin 15.2 12.0 - 16.0 g/dL   HCT 91.4 78.2 - 95.6 %   MCV 87.3 80.0 - 100.0 fL   MCH 28.4 26.0 - 34.0 pg   MCHC 32.5 32.0 - 36.0  g/dL   RDW 21.3 (H) 08.6 - 57.8 %   Platelets 264 150 -  440 K/uL  Urinalysis, Complete w Microscopic  Result Value Ref Range   Color, Urine YELLOW (A) YELLOW   APPearance CLEAR (A) CLEAR   Specific Gravity, Urine 1.023 1.005 - 1.030   pH 5.0 5.0 - 8.0   Glucose, UA 150 (A) NEGATIVE mg/dL   Hgb urine dipstick NEGATIVE NEGATIVE   Bilirubin Urine NEGATIVE NEGATIVE   Ketones, ur 5 (A) NEGATIVE mg/dL   Protein, ur NEGATIVE NEGATIVE mg/dL   Nitrite NEGATIVE NEGATIVE   Leukocytes, UA TRACE (A) NEGATIVE   RBC / HPF NONE SEEN 0 - 5 RBC/hpf   WBC, UA 0-5 0 - 5 WBC/hpf   Bacteria, UA NONE SEEN NONE SEEN   Squamous Epithelial / LPF 0-5 (A) NONE SEEN   Mucous PRESENT   Troponin I  Result Value Ref Range   Troponin I <0.03 <0.03 ng/mL  Influenza panel by PCR (type A & B)  Result Value Ref Range   Influenza A By PCR NEGATIVE NEGATIVE   Influenza B By PCR NEGATIVE NEGATIVE  HIV antibody (Routine Testing)  Result Value Ref Range   HIV Screen 4th Generation wRfx Non Reactive Non Reactive  Basic metabolic panel  Result Value Ref Range   Sodium 138 135 - 145 mmol/L   Potassium 4.3 3.5 - 5.1 mmol/L   Chloride 101 101 - 111 mmol/L   CO2 25 22 - 32 mmol/L   Glucose, Bld 239 (H) 65 - 99 mg/dL   BUN 21 (H) 6 - 20 mg/dL   Creatinine, Ser 4.09 (H) 0.44 - 1.00 mg/dL   Calcium 9.2 8.9 - 81.1 mg/dL   GFR calc non Af Amer 55 (L) >60 mL/min   GFR calc Af Amer >60 >60 mL/min   Anion gap 12 5 - 15  CBC  Result Value Ref Range   WBC 11.7 (H) 3.6 - 11.0 K/uL   RBC 5.15 3.80 - 5.20 MIL/uL   Hemoglobin 14.6 12.0 - 16.0 g/dL   HCT 91.4 78.2 - 95.6 %   MCV 87.6 80.0 - 100.0 fL   MCH 28.4 26.0 - 34.0 pg   MCHC 32.5 32.0 - 36.0 g/dL   RDW 21.3 (H) 08.6 - 57.8 %   Platelets 269 150 - 440 K/uL  Brain natriuretic peptide  Result Value Ref Range   B Natriuretic Peptide 14.0 0.0 - 100.0 pg/mL      Assessment & Plan:   Problem List Items Addressed This Visit      Respiratory   COPD exacerbation  (HCC)    Improving after hospital stay; will see lung specialist tomorrow      Relevant Medications   albuterol (PROVENTIL) (2.5 MG/3ML) 0.083% nebulizer solution     Endocrine   IFG (impaired fasting glucose)    Check glucose; patient not interested in metformin        Other   Vitamin D deficiency    1,000 iu vitamin D3 daily      Osteopenia determined by x-ray    Will order DEXA; 3 servings a day of calcium; taking 1,000 iu of vit D3 once a day; fall precautions          Follow up plan: Return in about 3 months (around 06/27/2016) for fasting labs and visit with Dr. Sherie Don.  An after-visit summary was printed and given to the patient at check-out.  Please see the patient instructions which may contain other information and recommendations beyond what is mentioned above in the assessment and plan.  Meds ordered  this encounter  Medications  . albuterol (PROVENTIL) (2.5 MG/3ML) 0.083% nebulizer solution    Sig: Take 3 mLs (2.5 mg total) by nebulization every 6 (six) hours as needed for wheezing or shortness of breath.    Dispense:  75 mL    Refill:  1    No orders of the defined types were placed in this encounter.

## 2016-03-27 NOTE — Assessment & Plan Note (Signed)
Will order DEXA; 3 servings a day of calcium; taking 1,000 iu of vit D3 once a day; fall precautions

## 2016-03-27 NOTE — Patient Instructions (Addendum)
Work on modest weight loss, try to lose 10 pounds over the next 3 months Recheck glucose and 3 month blood sugar average in 3 months at your follow-up (please come fasting) See Dr. Sung AmabileSimonds tomorrow

## 2016-03-27 NOTE — Assessment & Plan Note (Signed)
1,000 iu vitamin D3 daily

## 2016-03-27 NOTE — Assessment & Plan Note (Addendum)
Check glucose; patient not interested in metformin

## 2016-03-27 NOTE — Assessment & Plan Note (Signed)
Improving after hospital stay; will see lung specialist tomorrow

## 2016-03-28 ENCOUNTER — Encounter: Payer: Self-pay | Admitting: Pulmonary Disease

## 2016-03-28 ENCOUNTER — Ambulatory Visit (INDEPENDENT_AMBULATORY_CARE_PROVIDER_SITE_OTHER): Payer: Self-pay | Admitting: Pulmonary Disease

## 2016-03-28 VITALS — BP 136/84 | HR 101 | Wt 164.0 lb

## 2016-03-28 DIAGNOSIS — E669 Obesity, unspecified: Secondary | ICD-10-CM

## 2016-03-28 DIAGNOSIS — Z87891 Personal history of nicotine dependence: Secondary | ICD-10-CM

## 2016-03-28 DIAGNOSIS — R0609 Other forms of dyspnea: Secondary | ICD-10-CM

## 2016-03-28 DIAGNOSIS — J9611 Chronic respiratory failure with hypoxia: Secondary | ICD-10-CM

## 2016-03-28 DIAGNOSIS — J449 Chronic obstructive pulmonary disease, unspecified: Secondary | ICD-10-CM

## 2016-03-28 MED ORDER — FLUTICASONE-SALMETEROL 250-50 MCG/DOSE IN AEPB: 1.0000 | INHALATION_SPRAY | Freq: Two times a day (BID) | RESPIRATORY_TRACT | 10 refills | Status: DC

## 2016-03-28 NOTE — Patient Instructions (Addendum)
Stop Singulair Advair Diskus 25/50 - one inhalation twice a day Continue Proair as needed as first line rescue medicine Use nebulizer as emergency rescue medicine as second line rescue If nebulizer does not relieve shortness of breath > urgent care or ER Follow up in 6-8 weeks with lung function tests prior We will discuss strategies for weight loss at next visit

## 2016-03-30 NOTE — Progress Notes (Signed)
PULMONARY CONSULT NOTE  Requesting MD/Service: Lada Date of initial consultation: 03/28/16 Reason for consultation: COPD  PT PROFILE: 61 y.o. female former smoker (60 py history, quit 2013) hospitalized briefly in Mar 2018 with AECOPD  HPI:  As above. She was first diagnosed with COPD approx 10 yrs prior to this evaluation. She has never seen a pulmonologist and does not believe that she has ever had PFTs performed. Pt was hospitalized out of Dr Marlise Eves office when hypoxemia was noted. She was discharged after one day with home O2. She reports class III/IV dyspnea which has gradually progressed over the past 5 yrs. Denies CP, fever, purulent sputum, hemoptysis, LE edema and calf tenderness. She is maintained on Advair diskus and Singulair with PRN albuterol and now O2 @ 2 LPM by [place] and nebulized albuterol  Past Medical History:  Diagnosis Date  . Anemia   . Anxiety    followed by Dr. Janeece Riggers  . Asthma   . 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 Chronic kidney disease, stage II (mild)   . CKD (chronic kidney disease) stage 3, GFR 30-59 ml/min   . COPD (chronic obstructive pulmonary disease) (HCC)   . Depression    followed by Dr. Janeece Riggers  . Diverticulitis    hospitalized several times  . GERD (gastroesophageal reflux disease)   . Heart murmur   . Hiatal hernia   . Hypercholesteremia   . Hyperlipidemia   . Hypertension   . Leaky heart valve   . Low serum cortisol level (HCC) 06/03/2015  . Murmur   . Osteopenia determined by x-ray 03/27/2016  . Overactive bladder   . Panic attacks    followed by Dr. Janeece Riggers    Past Surgical History:  Procedure Laterality Date  . ABDOMINAL HYSTERECTOMY  2007   Total with BSO  . APPENDECTOMY  2006  . CARDIAC CATHETERIZATION N/A 01/07/2015   Procedure: Left Heart Cath and Coronary Angiography;  Surgeon: Lamar Blinks, MD;  Location: [facility] INVASIVE CV LAB;  Service: Cardiovascular;  Laterality: N/A;  . COLONOSCOPY  2013   Dr W[facility]alee Ferdinand at [facility] GI  . SIGMOID RESECTION / RECTOPEXY  10/17/13  . TUBAL LIGATION  1981  . UPPER GI ENDOSCOPY  2013   Dr Wandalee Ferdinand at [facility]    MEDICATIONS: I have reviewed all medications and confirmed regimen as documented  Social History   Social History  . Marital status: Married    Spouse name: N/A  . Number of children: N/A  . Years of education: N/A   Occupational History  . Not on file.   Social History Main Topics  . Smoking status: Former Smoker    Packs/day: 2.00    Years: 25.00    Types: Cigarettes    Quit date: 01/10/2011  . Smokeless tobacco: Never Used  . Alcohol use No  . Drug use: No  . Sexual activity: Not Currently   Other Topics Concern  . Not on file   Social History Narrative  . No narrative on file    Family History  Problem Relation Age of Onset  . Cancer Mother     colon  . Kidney disease Mother   . Arthritis Father   . Alcohol abuse Father   . Mental illness Father   . Diverticulitis Father   . Asthma Sister   . Hyperlipidemia Sister   . Hypertension Sister   . Migraines Sister   . Heart disease Brother   .  Hyperlipidemia Brother   . Hypertension Brother   . Stroke Brother   . Hypertension Daughter   . Hypertension Son   . Hypertension Sister   . Hypertension Sister   . Hypertension Sister   . Arthritis Sister   . Heart disease Sister   . Hypertension Sister   . Hypertension Brother   . Hyperlipidemia Brother   . Down syndrome Brother     ROS: No fever, myalgias/arthralgias, unexplained weight loss or weight gain No new focal weakness or sensory deficits No otalgia, hearing loss, visual changes, nasal and sinus symptoms, mouth and throat problems No neck pain or adenopathy No abdominal pain, N/V/D, diarrhea, change in bowel pattern No dysuria, change in urinary pattern   Vitals:   03/28/16 1216  BP: 136/84  Pulse: (!) 101  SpO2: 97%  Weight: 164 lb (74.4 kg)  2 LPM   EXAM:  Gen: Obese, No overt respiratory  distress HEENT: NCAT, sclera white, oropharynx normal Neck: Supple without LAN, thyromegaly, JVD Lungs: breath sounds decreased without adventitious sounds Cardiovascular: RRR, no murmurs noted Abdomen: Soft, nontender, normal BS Ext: without clubbing, cyanosis, edema Neuro: CNs grossly intact, motor and sensory intact Skin: Limited exam, no lesions noted  DATA:   BMP Latest Ref Rng & Units 03/15/2016 03/14/2016 03/07/2016  Glucose 65 - 99 mg/dL 161(W239(H) 88 91  BUN 6 - 20 mg/dL 96(E21(H) 17 17  Creatinine 0.44 - 1.00 mg/dL 4.54(U1.09(H) 9.81(X1.07(H) 9.14(N1.17(H)  BUN/Creat Ratio 9 - 23 - - -  Sodium 135 - 145 mmol/L 138 138 143  Potassium 3.5 - 5.1 mmol/L 4.3 4.0 5.0  Chloride 101 - 111 mmol/L 101 98(L) 104  CO2 22 - 32 mmol/L 25 32 31  Calcium 8.9 - 10.3 mg/dL 9.2 9.8 9.4    CBC Latest Ref Rng & Units 03/15/2016 03/14/2016 03/07/2016  WBC 3.6 - 11.0 K/uL 11.7(H) 12.2(H) 9.1  Hemoglobin 12.0 - 16.0 g/dL 82.914.6 56.215.2 13.015.0  Hematocrit 35.0 - 47.0 % 45.1 46.8 46.4(H)  Platelets 150 - 440 K/uL 269 264 290    CXR (03/14/16): Moderate kyphosis, NACPD    IMPRESSION:      1. Chronic respiratory failure with hypoxia (HCC)    2. Former smoker  3. Probable chronic obstructive pulmonary disease  4. DOE - likely multifactorial  5. Moderate obesity     PLAN:  Stop Singulair (no role in COPD) Advair Diskus 25/50 - one inhalation twice a day Continue Proair as needed as first line rescue medicine Use nebulizer as emergency rescue medicine as second line rescue If nebulizer does not relieve shortness of breath > urgent care or ER Follow up in 6-8 weeks with lung function tests prior We will discuss strategies for weight loss at next visit   Billy Fischeravid Cristo Ausburn, MD PCCM service Mobile [phone] Pager [phone] 03/30/2016

## 2016-04-24 ENCOUNTER — Institutional Professional Consult (permissible substitution): Payer: BLUE CROSS/BLUE SHIELD | Admitting: Internal Medicine

## 2016-05-15 ENCOUNTER — Other Ambulatory Visit: Payer: Self-pay | Admitting: Pulmonary Disease

## 2016-05-15 ENCOUNTER — Other Ambulatory Visit: Payer: Self-pay | Admitting: Family Medicine

## 2016-05-15 NOTE — Telephone Encounter (Signed)
Deferring this to pulmonologist

## 2016-05-18 ENCOUNTER — Other Ambulatory Visit: Payer: Self-pay

## 2016-05-18 ENCOUNTER — Telehealth: Payer: Self-pay | Admitting: Family Medicine

## 2016-05-18 DIAGNOSIS — Z1239 Encounter for other screening for malignant neoplasm of breast: Secondary | ICD-10-CM

## 2016-05-18 NOTE — Telephone Encounter (Signed)
Last year's mammogram order expiring Please enter new mammo order, encourage patient to schedule Thank you

## 2016-05-18 NOTE — Telephone Encounter (Signed)
Put the order in; left pt detail  Voicemail.

## 2016-05-23 ENCOUNTER — Ambulatory Visit: Payer: Self-pay | Attending: Pulmonary Disease

## 2016-05-23 DIAGNOSIS — J9611 Chronic respiratory failure with hypoxia: Secondary | ICD-10-CM | POA: Insufficient documentation

## 2016-05-23 DIAGNOSIS — J449 Chronic obstructive pulmonary disease, unspecified: Secondary | ICD-10-CM | POA: Insufficient documentation

## 2016-05-23 DIAGNOSIS — Z87891 Personal history of nicotine dependence: Secondary | ICD-10-CM

## 2016-05-23 MED ORDER — ALBUTEROL SULFATE (2.5 MG/3ML) 0.083% IN NEBU
2.5000 mg | INHALATION_SOLUTION | Freq: Once | RESPIRATORY_TRACT | Status: AC
  Administered 2016-05-23: 2.5 mg via RESPIRATORY_TRACT
  Filled 2016-05-23: qty 3

## 2016-05-24 ENCOUNTER — Emergency Department: Payer: Self-pay

## 2016-05-24 ENCOUNTER — Emergency Department
Admission: EM | Admit: 2016-05-24 | Discharge: 2016-05-24 | Disposition: A | Discharge status: Home / Self Care | Payer: Self-pay | Attending: Emergency Medicine | Admitting: Emergency Medicine

## 2016-05-24 ENCOUNTER — Encounter: Payer: Self-pay | Admitting: Emergency Medicine

## 2016-05-24 DIAGNOSIS — Z79899 Other long term (current) drug therapy: Secondary | ICD-10-CM | POA: Insufficient documentation

## 2016-05-24 DIAGNOSIS — I129 Hypertensive chronic kidney disease with stage 1 through stage 4 chronic kidney disease, or unspecified chronic kidney disease: Secondary | ICD-10-CM | POA: Insufficient documentation

## 2016-05-24 DIAGNOSIS — N183 Chronic kidney disease, stage 3 (moderate): Secondary | ICD-10-CM | POA: Insufficient documentation

## 2016-05-24 DIAGNOSIS — Z87891 Personal history of nicotine dependence: Secondary | ICD-10-CM | POA: Insufficient documentation

## 2016-05-24 DIAGNOSIS — J449 Chronic obstructive pulmonary disease, unspecified: Secondary | ICD-10-CM | POA: Insufficient documentation

## 2016-05-24 DIAGNOSIS — J441 Chronic obstructive pulmonary disease with (acute) exacerbation: Secondary | ICD-10-CM | POA: Insufficient documentation

## 2016-05-24 DIAGNOSIS — J45909 Unspecified asthma, uncomplicated: Secondary | ICD-10-CM | POA: Insufficient documentation

## 2016-05-24 LAB — BASIC METABOLIC PANEL
Anion gap: 6 (ref 5–15)
BUN: 17 mg/dL (ref 6–20)
CHLORIDE: 103 mmol/L (ref 101–111)
CO2: 31 mmol/L (ref 22–32)
Calcium: 9.5 mg/dL (ref 8.9–10.3)
Creatinine, Ser: 1.13 mg/dL — ABNORMAL HIGH (ref 0.44–1.00)
GFR calc Af Amer: >60 mL/min (ref >60)
GFR calc non Af Amer: 53 mL/min — ABNORMAL LOW (ref >60)
GLUCOSE: 112 mg/dL — AB (ref 65–99)
POTASSIUM: 3.8 mmol/L (ref 3.5–5.1)
Sodium: 140 mmol/L (ref 135–145)

## 2016-05-24 LAB — CBC
HEMATOCRIT: 41.7 % (ref 35.0–47.0)
HEMOGLOBIN: 13.7 g/dL (ref 12.0–16.0)
MCH: 28.3 pg (ref 26.0–34.0)
MCHC: 32.9 g/dL (ref 32.0–36.0)
MCV: 86 fL (ref 80.0–100.0)
Platelets: 260 10*3/uL (ref 150–440)
RBC: 4.85 MIL/uL (ref 3.80–5.20)
RDW: 14.3 % (ref 11.5–14.5)
WBC: 10.6 10*3/uL (ref 3.6–11.0)

## 2016-05-24 LAB — BRAIN NATRIURETIC PEPTIDE: B NATRIURETIC PEPTIDE 5: 21 pg/mL (ref 0.0–100.0)

## 2016-05-24 LAB — TROPONIN I

## 2016-05-24 MED ORDER — PREDNISONE 20 MG PO TABS: 60.0000 mg | ORAL_TABLET | Freq: Every day | ORAL | 0 refills | Status: DC

## 2016-05-24 MED ORDER — AZITHROMYCIN 500 MG PO TABS
500.0000 mg | ORAL_TABLET | Freq: Once | ORAL | Status: AC
Start: 2016-05-24 — End: 2016-05-24
  Administered 2016-05-24: 500 mg via ORAL
  Filled 2016-05-24: qty 1

## 2016-05-24 MED ORDER — IPRATROPIUM-ALBUTEROL 0.5-2.5 (3) MG/3ML IN SOLN
3.0000 mL | Freq: Once | RESPIRATORY_TRACT | Status: AC
  Administered 2016-05-24: 3 mL via RESPIRATORY_TRACT
  Filled 2016-05-24: qty 3

## 2016-05-24 MED ORDER — AZITHROMYCIN 250 MG PO TABS: ORAL_TABLET | ORAL | 0 refills | Status: DC

## 2016-05-24 MED ORDER — METHYLPREDNISOLONE SODIUM SUCC 125 MG IJ SOLR
125.0000 mg | Freq: Once | INTRAMUSCULAR | Status: AC
  Administered 2016-05-24: 125 mg via INTRAVENOUS
  Filled 2016-05-24: qty 2

## 2016-05-24 MED ORDER — ONDANSETRON 4 MG PO TBDP: ORAL_TABLET | ORAL | 0 refills | Status: DC

## 2016-05-24 MED ORDER — ALBUTEROL SULFATE (2.5 MG/3ML) 0.083% IN NEBU
5.0000 mg | INHALATION_SOLUTION | Freq: Once | RESPIRATORY_TRACT | Status: AC
  Administered 2016-05-24: 5 mg via RESPIRATORY_TRACT
  Filled 2016-05-24: qty 6

## 2016-05-24 NOTE — ED Provider Notes (Signed)
[facility] Emergency Department Provider Note  ____________________________________________   First MD Initiated Contact with Patient 05/24/16 1624     (approximate)  I have reviewed the triage vital signs and the nursing notes.   HISTORY  Chief Complaint Shortness of Breath    HPI Kylie [name] is a 61 y.o. female with a history of COPD on 2 L of oxygen all the time at home who presents for evaluation of gradually worsening shortness of breath and coughing episodes over the last 2 days.  She states that she always feels short of breath but it has been worse and when she has a coughing episode sometimes she vomits. She sees Dr. Sung Amabile with pulmonology and has an appointment in clinic in 2 days but did not think she should wait that long.  She denies fever/chills, chest pain, and nausea, vomiting except for posttussive emesis, abdominal pain, dysuria.  It makes her symptoms worse and rest makes them a little bit better.  She describes her shortness of breath and coughing as severe.  She is not on long-term prednisone because her pulmonologist does not want her to be on it as a regular, chronic med, and it is "been a while" since her last burst dose.   Past Medical History:  Diagnosis Date  . Anemia   . Anxiety    followed by Dr. Janeece Riggers  . Asthma   . 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 Chronic kidney disease, stage II (mild)   . CKD (chronic kidney disease) stage 3, GFR 30-59 ml/min   . COPD (chronic obstructive pulmonary disease) (HCC)   . Depression    followed by Dr. Janeece Riggers  . Diverticulitis    hospitalized several times  . GERD (gastroesophageal reflux disease)   . Heart murmur   . Hiatal hernia   . Hypercholesteremia   . Hyperlipidemia   . Hypertension   . Leaky heart valve   . Low serum cortisol level (HCC) 06/03/2015  . Murmur   . Osteopenia determined by x-ray 03/27/2016  . Overactive bladder   . Panic attacks      followed by Dr. Janeece Riggers    Patient Active Problem List   Diagnosis Date Noted  . Osteopenia determined by x-ray 03/27/2016  . COPD (chronic obstructive pulmonary disease) (HCC) 03/15/2016  . COPD exacerbation (HCC) 03/14/2016  . Medication monitoring encounter 03/07/2016  . Low serum cortisol level (HCC) 06/03/2015  . Vitamin B12 deficiency 06/03/2015  . Hyperlipidemia LDL goal <70 06/03/2015  . Abnormal weight gain 05/20/2015  . Vitamin D deficiency 05/20/2015  . Breast cancer screening 05/20/2015  . IFG (impaired fasting glucose) 02/09/2015  . Striae 02/09/2015  . Elevated red blood cell count 02/09/2015  . Weight gain, abnormal 02/09/2015  . Unstable angina (HCC) 01/07/2015  . Angina at rest [facility]) 01/06/2015  . Vascular calcification 12/29/2014  . Heart murmur 12/29/2014  . Hx of angina pectoris 12/29/2014  . Facial numbness 12/29/2014  . Right arm numbness 12/29/2014  . Right leg numbness 12/29/2014  . Xanthelasma of eyelid 10/09/2014  . AVM (arteriovenous malformation) 09/16/2014  . Essential hypertension, benign 09/16/2014  . Onycholysis of toenail 09/16/2014  . Vaginal atrophy 09/16/2014  . Bilateral hand pain 09/16/2014  . Chronic kidney disease, stage II (mild)   . Hypercholesteremia   . Sigmoid diverticulitis 09/30/2013    Past Surgical History:  Procedure Laterality Date  . ABDOMINAL HYSTERECTOMY  2007   Total with BSO  .  APPENDECTOMY  2006  . CARDIAC CATHETERIZATION N/A 01/07/2015   Procedure: Left Heart Cath and Coronary Angiography;  Surgeon: Lamar BlinksBruce J Kowalski, MD;  Location: [facility] INVASIVE CV LAB;  Service: Cardiovascular;  Laterality: N/A;  . COLONOSCOPY  2013   [facility]  . SIGMOID RESECTION / RECTOPEXY  10/17/13  . TUBAL LIGATION  1981  . UPPER GI ENDOSCOPY  2013   [facility] Ganem [facility]    Prior to Admission medications   Medication Sig Start Date End Date Taking? Authorizing Provider  albuterol (PROVENTIL) (2.5 MG/3ML) 0.083%  nebulizer solution Take 3 mLs (2.5 mg total) by nebulization every 6 (six) hours as needed for wheezing or shortness of breath. 03/27/16  Yes Lada, Janit BernMelinda P, MD  ALPRAZolam Prudy Feeler(XANAX) 1 MG tablet Take 1 mg by mouth 4 (four) times daily as needed.  09/02/13  Yes [provider]  calcium carbonate (TUMS - DOSED IN MG ELEMENTAL CALCIUM) 500 MG chewable tablet Chew 1 tablet by mouth daily.   Yes [provider]  Fluticasone-Salmeterol (ADVAIR DISKUS) 250-50 MCG/DOSE AEPB Inhale 1 puff into the lungs 2 (two) times daily. 03/28/16 03/28/17 Yes Merwyn KatosSimonds, David B, MD  metoprolol succinate (TOPROL-XL) 50 MG 24 hr tablet Take 1 tablet (50 mg total) by mouth daily. 09/29/15  Yes Lada, Janit BernMelinda P, MD  PROAIR HFA 108 (90 Base) MCG/ACT inhaler INHALE 2 PUFFS BY MOUTH EVERY 4 HOURS ASNEEDED FOR WHEEZING OR SHORTNESS OF BREATH 05/15/16  Yes Merwyn KatosSimonds, David B, MD  azithromycin (ZITHROMAX) 250 MG tablet Take 1 tablet PO daily for 4 more days starting the day after your visit to the Emergency Department 05/24/16   Loleta RoseForbach, Rayleen Wyrick, MD  ondansetron (ZOFRAN ODT) 4 MG disintegrating tablet Allow 1-2 tablets to dissolve in your mouth every 8 hours as needed for nausea/vomiting 05/24/16   Loleta RoseForbach, Flecia Shutter, MD  predniSONE (DELTASONE) 20 MG tablet Take 3 tablets (60 mg total) by mouth daily. 05/24/16   Loleta RoseForbach, Maralee Higuchi, MD    Allergies Cephalexin; Fish allergy; Ivp dye [iodinated diagnostic agents]; Nitrofurantoin; Sulfa antibiotics; Aspirin; Ciprofloxacin; Doxycycline; Other; Oxycodone; Percocet [oxycodone-acetaminophen]; Prednisone; Tequin [gatifloxacin]; Latex; Myrbetriq [mirabegron]; and Premarin [conjugated estrogens]  Family History  Problem Relation Age of Onset  . Cancer Mother        colon  . Kidney disease Mother   . Arthritis Father   . Alcohol abuse Father   . Mental illness Father   . Diverticulitis Father   . Asthma Sister   . Hyperlipidemia Sister   . Hypertension Sister   . Migraines Sister   . Heart  disease Brother   . Hyperlipidemia Brother   . Hypertension Brother   . Stroke Brother   . Hypertension Daughter   . Hypertension Son   . Hypertension Sister   . Hypertension Sister   . Hypertension Sister   . Arthritis Sister   . Heart disease Sister   . Hypertension Sister   . Hypertension Brother   . Hyperlipidemia Brother   . Down syndrome Brother     Social History Social History  Substance Use Topics  . Smoking status: Former Smoker    Packs/day: 2.00    Years: 25.00    Types: Cigarettes    Quit date: 01/10/2011  . Smokeless tobacco: Never Used  . Alcohol use No    Review of Systems Constitutional: No fever/chills Eyes: No visual changes. ENT: No sore throat. Cardiovascular: Denies chest pain. Respiratory: +shortness of breath And severe coughing Gastrointestinal:  No abdominal pain.  No nausea, no vomiting except for occasional posttussive emesis.  No diarrhea.  No constipation. Genitourinary: Negative for dysuria. Musculoskeletal: Negative for back pain. Integumentary: Negative for rash. Neurological: Negative for headaches, focal weakness or numbness.   ____________________________________________   PHYSICAL EXAM:  VITAL SIGNS: ED Triage Vitals  Enc Vitals Group     BP 05/24/16 1510 (!) 148/77     Pulse Rate 05/24/16 1510 (!) 106     Resp 05/24/16 1510 20     Temp 05/24/16 1510 98.9 F (37.2 C)     Temp Source 05/24/16 1510 Oral     SpO2 05/24/16 1510 91 %     Weight 05/24/16 1510 168 lb (76.2 kg)     Height 05/24/16 1510 4\' 10"  (1.473 m)     Head Circumference --      Peak Flow --      Pain Score 05/24/16 1509 7     Pain Loc --      Pain Edu? --      Excl. in GC? --     Constitutional: Alert and oriented.  Appears chronically ill from Pulmonary disease but in no acute distress. Eyes: Conjunctivae are normal. EOMI. Head: Atraumatic. Nose: No congestion/rhinnorhea. Mouth/Throat: Mucous membranes are moist. Neck: No stridor.  No meningeal  signs.   Cardiovascular: Borderline tachycardia, regular rhythm. Good peripheral circulation. Grossly normal heart sounds. Respiratory: Normal respiratory effort.  No retractions. Expiratory wheezing throughout lung fields Gastrointestinal: Soft and nontender. No distention.  Musculoskeletal: No lower extremity tenderness nor edema. No gross deformities of extremities. Neurologic:  Normal speech and language. No gross focal neurologic deficits are appreciated.  Skin:  Skin is warm, dry and intact. No rash noted. Psychiatric: Mood and affect are normal. Speech and behavior are normal.  ____________________________________________   LABS (all labs ordered are listed, but only abnormal results are displayed)  Labs Reviewed  BASIC METABOLIC PANEL - Abnormal; Notable for the following:       Result Value   Glucose, Bld 112 (*)    Creatinine, Ser 1.13 (*)    GFR calc non Af Amer 53 (*)    All other components within normal limits  CBC  TROPONIN I  BRAIN NATRIURETIC PEPTIDE   ____________________________________________  EKG  ED ECG REPORT I, Walfred Bettendorf, the attending physician, personally viewed and interpreted this ECG.  Date: 05/24/2016 EKG Time: 15:16 Rate: 103 Rhythm: borderline sinus tachycardia QRS Axis: normal Intervals: normal ST/T Wave abnormalities: normal Conduction Disturbances: none Narrative Interpretation: unremarkable  ____________________________________________  RADIOLOGY   Dg Chest 2 View  Result Date: 05/24/2016 CLINICAL DATA:  Worsening shortness of breath.  Coughing. EXAM: CHEST  2 VIEW COMPARISON:  03/14/2016. FINDINGS: Mediastinum and hilar structures are normal. Heart size normal. Low lung volumes. Mild basilar interstitial prominence noted. Mild pneumonitis cannot be completely excluded. No pleural effusion or pneumothorax IMPRESSION: Low lung volumes. Mild basilar interstitial prominence. Mild pneumonitis cannot be completely excluded.  Electronically Signed   By: Maisie Fus  Register   On: 05/24/2016 17:21    ____________________________________________   PROCEDURES  Critical Care performed: No   Procedure(s) performed:   Procedures   ____________________________________________   INITIAL IMPRESSION / ASSESSMENT AND PLAN / ED COURSE  Pertinent labs & imaging results that were available during my care of the patient were reviewed by me and considered in my medical decision making (see chart for details).  No acute distress, chronic lung disease, reassuring labs including no leukocytosis.  spO2 was  initially measured on room air but she is on 2 L of oxygen chronically, and on 2L O2 [place] she is in the mid-90s.  Will obtain 2-view CXR and check a BNP, as well as give duonebs x 3 and solumedrol (she prefers IV rather than PO meds).  Anticipate d/c with outpatient follow up as scheduled with pulmonology in 2 days.   Clinical Course as of May 25 1830  Wed May 24, 2016  1610 The patient is having an animated conversation with her family, and no acute respiratory distress.  She does have occasional coughing episodes.  Her lab work was reassuring.  Her radiology report on the chest x-ray questions the possibility of some pneumonitis.  I find this unlikely based on the rest of her workup and her physical exam but I will treat her empirically with azithromycin as this may help with her chronic COPD as well.  First dose in the emergency department since the pharmacy is closed.  She will follow up with her pulmonologist in 2 days.  I gave my usual and customary return precautions.     [CF]    Clinical Course User Index [CF] Loleta Rose, MD    ____________________________________________  FINAL CLINICAL IMPRESSION(S) / ED DIAGNOSES  Final diagnoses:  COPD exacerbation (HCC)     MEDICATIONS GIVEN DURING THIS VISIT:  Medications  azithromycin (ZITHROMAX) tablet 500 mg (not administered)  albuterol (PROVENTIL) (2.5  MG/3ML) 0.083% nebulizer solution 5 mg (5 mg Nebulization Given 05/24/16 1518)  ipratropium-albuterol (DUONEB) 0.5-2.5 (3) MG/3ML nebulizer solution 3 mL (3 mLs Nebulization Given 05/24/16 1715)  ipratropium-albuterol (DUONEB) 0.5-2.5 (3) MG/3ML nebulizer solution 3 mL (3 mLs Nebulization Given 05/24/16 1715)  ipratropium-albuterol (DUONEB) 0.5-2.5 (3) MG/3ML nebulizer solution 3 mL (3 mLs Nebulization Given 05/24/16 1715)  methylPREDNISolone sodium succinate (SOLU-MEDROL) 125 mg/2 mL injection 125 mg (125 mg Intravenous Given 05/24/16 1716)     NEW OUTPATIENT MEDICATIONS STARTED DURING THIS VISIT:  New Prescriptions   AZITHROMYCIN (ZITHROMAX) 250 MG TABLET    Take 1 tablet PO daily for 4 more days starting the day after your visit to the Emergency Department   ONDANSETRON (ZOFRAN ODT) 4 MG DISINTEGRATING TABLET    Allow 1-2 tablets to dissolve in your mouth every 8 hours as needed for nausea/vomiting   PREDNISONE (DELTASONE) 20 MG TABLET    Take 3 tablets (60 mg total) by mouth daily.    Modified Medications   No medications on file    Discontinued Medications   MONTELUKAST (SINGULAIR) 10 MG TABLET    TAKE 1 TABLET BY MOUTH EVERY NIGHT AT BEDTIME.     Note:  This document was prepared using Dragon voice recognition software and may include unintentional dictation errors.   Loleta Rose, MD 05/24/16 [phone]

## 2016-05-24 NOTE — ED Notes (Signed)
Pt verbalized understanding of discharge instructions. NAD at this time. 

## 2016-05-24 NOTE — ED Triage Notes (Signed)
Pt reports increased SOB over the past two days. Pt has history of COPD. Pt appears uncomfortable in triage but is able to speak in complete sentences. Pt room air SpO2 91-92%.

## 2016-05-24 NOTE — Discharge Instructions (Signed)
We believe that your symptoms are caused today by an exacerbation of your COPD.  Please take the prescribed medications and any medications that you have at home for your COPD.  Follow up with your doctor as recommended.  If you develop any new or worsening symptoms, including but not limited to fever, persistent vomiting, worsening shortness of breath, or other symptoms that concern you, please return to the Emergency Department immediately.  

## 2016-05-26 ENCOUNTER — Ambulatory Visit (INDEPENDENT_AMBULATORY_CARE_PROVIDER_SITE_OTHER): Payer: Self-pay | Admitting: Pulmonary Disease

## 2016-05-26 ENCOUNTER — Encounter: Payer: Self-pay | Admitting: Pulmonary Disease

## 2016-05-26 VITALS — BP 132/82 | HR 99 | Resp 16 | Ht <= 58 in | Wt 162.0 lb

## 2016-05-26 DIAGNOSIS — J441 Chronic obstructive pulmonary disease with (acute) exacerbation: Secondary | ICD-10-CM

## 2016-05-26 DIAGNOSIS — J9611 Chronic respiratory failure with hypoxia: Secondary | ICD-10-CM

## 2016-05-26 DIAGNOSIS — J449 Chronic obstructive pulmonary disease, unspecified: Secondary | ICD-10-CM

## 2016-05-26 NOTE — Patient Instructions (Signed)
Complete prednisone and a azithromycin as prescribed to you during your recent ER visit Continue Advair Continue albuterol inhaler and nebulizer medications as needed  Use more liberally than normal during this acute exacerbation until you are back to baseline  Follow-up in 4-6 weeks at which time we will consider adjustments in your maintenance indications and will discuss enrollment in pulmonary rehabilitation

## 2016-05-28 NOTE — Progress Notes (Signed)
PULMONARY OFFICE FOLLOW UP NOTE  Requesting MD/Service: Lada Date of initial consultation: 03/28/16 Reason for consultation: COPD  PT PROFILE: 61 y.o. female former smoker (60 py history, quit 2013) hospitalized briefly in Mar 2018 with AECOPD  DATA: 05/23/16 PFTs: FVC:  1.50 > 1.67 L ( 64 > 71 %pred), FEV1:  0.82 > 0.83L ( 42 > 43%pred), FEV1/FVC: 50% , TLC:  3.85 L (104 %pred), DLCO 34 %pred 05/24/16 CXR: bibasilar interstitial prominence   INTERVAL:  Seen in ED 05/16 with increased dyspnea, chest tightness, cough productive of thick white/yellow mucus. Treated with azithromycin and prednisone  SUBJ: As above. Now a little improved. Reports CP due to vigorous coughing. Denies fever, hemoptysis, LE edema and calf tenderness. Using rescue meds (MDI and nebs) up to 4 times per day.   Vitals:   05/26/16 1125  BP: 132/82  Pulse: 99  Resp: 16  SpO2: 91%  Weight: 162 lb (73.5 kg)  Height: 4\' 10"  (1.473 m)  2 LPM   EXAM:  Gen: Obese, No respiratory distress at rest HEENT: WNL Neck: Supple without JVD Lungs: breath sounds moderately decreased with minimal scattered wheezes Cardiovascular: RRR, no murmurs noted Abdomen: Soft, nontender, normal BS Ext: without clubbing, cyanosis, edema Neuro: CNs grossly intact, motor and sensory intact Skin: Limited exam, no lesions noted  DATA:   BMP Latest Ref Rng & Units 05/24/2016 03/15/2016 03/14/2016  Glucose 65 - 99 mg/dL 161(W112(H) 960(A239(H) 88  BUN 6 - 20 mg/dL 17 54(U21(H) 17  Creatinine 0.44 - 1.00 mg/dL 9.81(X1.13(H) 9.14(N1.09(H) 8.29(F1.07(H)  BUN/Creat Ratio 9 - 23 - - -  Sodium 135 - 145 mmol/L 140 138 138  Potassium 3.5 - 5.1 mmol/L 3.8 4.3 4.0  Chloride 101 - 111 mmol/L 103 101 98(L)  CO2 22 - 32 mmol/L 31 25 32  Calcium 8.9 - 10.3 mg/dL 9.5 9.2 9.8    CBC Latest Ref Rng & Units 05/24/2016 03/15/2016 03/14/2016  WBC 3.6 - 11.0 K/uL 10.6 11.7(H) 12.2(H)  Hemoglobin 12.0 - 16.0 g/dL 62.113.7 30.814.6 65.715.2  Hematocrit 35.0 - 47.0 % 41.7 45.1 46.8  Platelets 150 -  440 K/uL 260 269 264    CXR: as above. Reviewed by me  IMPRESSION:   Chronic respiratory failure with hypoxia (HCC)  COPD, severe (HCC)  COPD with acute exacerbation (HCC)    PLAN:  Complete prednisone and a azithromycin as prescribed during recent ER visit Continue Advair Continue albuterol inhaler and nebulizer medications as needed  - Use more liberally during acute exacerbation until back to baseline Follow-up in 4-6 weeks at which time we will consider adjustments in maintenance indications and will discuss enrollment in pulmonary rehabilitation  Billy Fischeravid Terez Freimark, MD PCCM service Mobile [phone] Pager [phone] 05/28/2016

## 2016-07-06 ENCOUNTER — Ambulatory Visit (INDEPENDENT_AMBULATORY_CARE_PROVIDER_SITE_OTHER): Payer: Self-pay | Admitting: Pulmonary Disease

## 2016-07-06 ENCOUNTER — Encounter: Payer: Self-pay | Admitting: Pulmonary Disease

## 2016-07-06 VITALS — BP 138/94 | HR 112 | Ht <= 58 in | Wt 161.0 lb

## 2016-07-06 DIAGNOSIS — J441 Chronic obstructive pulmonary disease with (acute) exacerbation: Secondary | ICD-10-CM

## 2016-07-06 DIAGNOSIS — J9611 Chronic respiratory failure with hypoxia: Secondary | ICD-10-CM

## 2016-07-06 DIAGNOSIS — Z595 Extreme poverty: Secondary | ICD-10-CM

## 2016-07-06 MED ORDER — AZITHROMYCIN 250 MG PO TABS: ORAL_TABLET | ORAL | 0 refills | Status: AC

## 2016-07-06 MED ORDER — FLUTICASONE FUROATE-VILANTEROL 100-25 MCG/INH IN AEPB: 1.0000 | INHALATION_SPRAY | Freq: Every day | RESPIRATORY_TRACT | 0 refills | Status: DC

## 2016-07-06 MED ORDER — IPRATROPIUM-ALBUTEROL 0.5-2.5 (3) MG/3ML IN SOLN: 3.0000 mL | RESPIRATORY_TRACT | 5 refills | Status: AC | PRN

## 2016-07-06 NOTE — Patient Instructions (Signed)
Z-Pak ordered Change nebulized albuterol to ipratropium-albuterol (Duoneb) - use 3-4 times per day Samples of Breo provided When insurance coverage is regained, resume Advair as previously prescribed  Follow-up in 3-4 months

## 2016-07-07 NOTE — Progress Notes (Signed)
PULMONARY OFFICE FOLLOW UP NOTE  Requesting MD/Service: Lada Date of initial consultation: 03/28/16 Reason for consultation: COPD  PT PROFILE: 61 y.o. female former smoker (60 py history, quit 2013) hospitalized briefly in Mar 2018 with AECOPD  DATA: 05/23/16 PFTs: FVC:  1.50 > 1.67 L ( 64 > 71 %pred), FEV1:  0.82 > 0.83L ( 42 > 43%pred), FEV1/FVC: 50% , TLC:  3.85 L (104 %pred), DLCO 34 %pred 05/24/16 CXR: bibasilar interstitial prominence   INTERVAL:  No major events  SUBJ: Last visit, she had just been seen in the emergency department for COPD exacerbation and was treated with azithromycin and prednisone. She completed these treatments and was getting better. However, over the last several days, she has again developed increasing nasal and chest congestion. She also notes increasing dyspnea and chest tightness. She has cough productive of yellow mucus. She denies fever, hemoptysis, lower extremity edema, calf tenderness.  She is no longer on Advair due to its cost. She is presently only using nebulized albuterol.  Vitals:   07/06/16 1059 07/06/16 1105  BP:  (!) 138/94  Pulse:  (!) 112  SpO2:  93%  Weight: 161 lb (73 kg)   Height: 4\' 10"  (1.473 m)   2 LPM   EXAM:  Gen: Obese, No respiratory distress at rest HEENT: WNL Neck: Supple without JVD Lungs: breath sounds moderately Diminished with few scattered wheezes Cardiovascular: RRR, no murmurs noted Abdomen: Soft, nontender, normal BS Ext: without clubbing, cyanosis, edema Neuro: CNs grossly intact, motor and sensory intact Skin: Limited exam, no lesions noted  DATA:   BMP Latest Ref Rng & Units 05/24/2016 03/15/2016 03/14/2016  Glucose 65 - 99 mg/dL 440(N112(H) 027(O239(H) 88  BUN 6 - 20 mg/dL 17 53(G21(H) 17  Creatinine 0.44 - 1.00 mg/dL 6.44(I1.13(H) 3.47(Q1.09(H) 2.59(D1.07(H)  BUN/Creat Ratio 9 - 23 - - -  Sodium 135 - 145 mmol/L 140 138 138  Potassium 3.5 - 5.1 mmol/L 3.8 4.3 4.0  Chloride 101 - 111 mmol/L 103 101 98(L)  CO2 22 - 32 mmol/L 31  25 32  Calcium 8.9 - 10.3 mg/dL 9.5 9.2 9.8    CBC Latest Ref Rng & Units 05/24/2016 03/15/2016 03/14/2016  WBC 3.6 - 11.0 K/uL 10.6 11.7(H) 12.2(H)  Hemoglobin 12.0 - 16.0 g/dL 63.813.7 75.614.6 43.315.2  Hematocrit 35.0 - 47.0 % 41.7 45.1 46.8  Platelets 150 - 440 K/uL 260 269 264    CXR: NNF  IMPRESSION:   Mild to moderate COPD with frequent exacerbations  Currently with mild exacerbation (discolored mucus and wheezing) Chronic hypoxemic respiratory failure Medically indigent - unable to afford Advair   PLAN:  Repeat course of Z-Pak ordered Change nebulized albuterol to ipratropium-albuterol (Duoneb) - use 3-4 times per day Samples of Breo provided When insurance coverage (Medicaid) is regained, resume Advair as previously prescribed  Follow-up in 3-4 months   Billy Fischeravid Simonds, MD PCCM service Mobile [phone] Pager [phone] 07/07/2016

## 2016-09-21 ENCOUNTER — Other Ambulatory Visit: Payer: Self-pay | Admitting: Family Medicine

## 2016-10-16 ENCOUNTER — Encounter: Payer: Self-pay | Admitting: Pulmonary Disease

## 2016-10-16 ENCOUNTER — Ambulatory Visit (INDEPENDENT_AMBULATORY_CARE_PROVIDER_SITE_OTHER): Payer: Self-pay | Admitting: Pulmonary Disease

## 2016-10-16 VITALS — BP 144/80 | HR 91 | Resp 16 | Ht <= 58 in | Wt 163.0 lb

## 2016-10-16 DIAGNOSIS — J9611 Chronic respiratory failure with hypoxia: Secondary | ICD-10-CM

## 2016-10-16 DIAGNOSIS — J449 Chronic obstructive pulmonary disease, unspecified: Secondary | ICD-10-CM

## 2016-10-16 NOTE — Patient Instructions (Signed)
Continue oxygen therapy - 2 L/m by [place] as close to 24 hours per day as possible  Continue nebulized albuterol-ipratropium (DuoNeb) 3-4 times per day as needed  Get your flu shot this year  Follow-up in 4 months or sooner as needed

## 2016-10-17 ENCOUNTER — Other Ambulatory Visit: Payer: Self-pay | Admitting: Family Medicine

## 2016-10-17 NOTE — Progress Notes (Signed)
PULMONARY OFFICE FOLLOW UP NOTE  Requesting MD/Service: Lada Date of initial consultation: 03/28/16 Reason for consultation: COPD  PT PROFILE: 61 y.o. female former smoker (60 py history, quit 2013) hospitalized briefly in Mar 2018 with AECOPD  DATA: 05/23/16 PFTs: FVC:  1.50 > 1.67 L ( 64 > 71 %pred), FEV1:  0.82 > 0.83L ( 42 > 43%pred), FEV1/FVC: 50% , TLC:  3.85 L (104 %pred), DLCO 34 %pred 05/24/16 CXR: bibasilar interstitial prominence   INTERVAL:  Last seen 07/06/16. No major events  SUBJ: No major changes since last visit. However, she states that she still "feels bad". She continues to have cough which is "real bad". And is occasionally productive of yellow mucus. She denies fever, hemoptysis, lower extremity edema, calf tenderness.  She is no longer on Breo due to its cost. She is presently only using nebulized albuterol.  Vitals:   10/16/16 1503 10/16/16 1507  BP:  (!) 144/80  Pulse:  91  Resp: 16   SpO2:  93%  Weight: 73.9 kg (163 lb)   Height:  (1.473 m)   2 LPM   EXAM:  Gen: Obese, In a wheelchair, no apparent respiratory distress at rest HEENT: WNL Neck: Supple without JVD Lungs: breath sounds moderately diminished without wheezes or adventitious sounds Cardiovascular: RRR, no murmurs noted Abdomen: Obese, soft, nontender, normal BS Ext: without clubbing, cyanosis, edema Neuro: No focal deficits  DATA:   BMP Latest Ref Rng & Units 05/24/2016 03/15/2016 03/14/2016  Glucose 65 - 99 mg/dL 161(W) 960(A) 88  BUN 6 - 20 mg/dL 17 54(U) 17  Creatinine 0.44 - 1.00 mg/dL 9.81(X) 9.14(N) 8.29(F)  BUN/Creat Ratio 9 - 23 - - -  Sodium 135 - 145 mmol/L 140 138 138  Potassium 3.5 - 5.1 mmol/L 3.8 4.3 4.0  Chloride 101 - 111 mmol/L 103 101 98(L)  CO2 22 - 32 mmol/L 31 25 32  Calcium 8.9 - 10.3 mg/dL 9.5 9.2 9.8    CBC Latest Ref Rng & Units 05/24/2016 03/15/2016 03/14/2016  WBC 3.6 - 11.0 K/uL 10.6 11.7(H) 12.2(H)  Hemoglobin 12.0 - 16.0 g/dL 62.1 30.8 65.7   Hematocrit 35.0 - 47.0 % 41.7 45.1 46.8  Platelets 150 - 440 K/uL 260 269 264    CXR: NNF  IMPRESSION:   Mild to moderate COPD with frequent exacerbations Chronic bronchitis Chronic hypoxemic respiratory failure Medically indigent - unable to afford Advair, Breo etc.   PLAN:  Continue oxygen therapy - 2 lpm [place] as close to 24 hours per day as possible Continue nebulized albuterol-ipratropium (DuoNeb) 3-4 times per day as needed  I recommended that she get a flu shot this year  Follow-up in 4 months or sooner as needed   Billy Fischer, MD PCCM service Mobile [phone] Pager [phone] 10/17/2016

## 2016-10-27 ENCOUNTER — Telehealth: Payer: Self-pay | Admitting: Pulmonary Disease

## 2016-10-27 NOTE — Telephone Encounter (Signed)
Pt states you mentioned an abx when she was in clinic. Now she calling asking for the abx. States she is congested, runny nose, cough. Please advise. States she would Augmentin liquid form.

## 2016-10-27 NOTE — Telephone Encounter (Signed)
Pt thinks she would like an antibiotic. Please call . She requests Augmentin liquid form.

## 2016-11-28 ENCOUNTER — Ambulatory Visit: Payer: Self-pay | Admitting: Family Medicine

## 2017-02-01 ENCOUNTER — Telehealth: Payer: Self-pay | Admitting: Pulmonary Disease

## 2017-02-01 NOTE — Telephone Encounter (Signed)
Patient called to state that for insurance purposes she will not be able to continue care with Dr. Sung AmabileSimonds Delet[place] recall

## 2017-04-04 ENCOUNTER — Telehealth: Payer: Self-pay | Admitting: Family Medicine

## 2017-04-04 NOTE — Telephone Encounter (Signed)
-----   Message from Darlyn ChamberMiel R [place] sent at 04/04/2017  3:35 PM EDT ----- Regarding: RE: another echo from 2016   ----- Message ----- From: Kerman PasseyLada, Amare Bail P, MD Sent: 04/04/2017  12:12 PM To: Miel R Mock Subject: another echo from 2016                         Another echo from 2016  ----- Message ----- From: Interface, Rad Results In Sent: 04/03/2017  11:16 PM To: Kerman PasseyMelinda P Montina Dorrance, MD

## 2017-04-04 NOTE — Telephone Encounter (Signed)
Pt calling back states that she is unsure when she can come in for an appointment because she does not have insurance and cannot afford an appt.

## 2017-04-04 NOTE — Telephone Encounter (Signed)
°  Left voice message on [phone] @ 4:01 for patient to schedule appt with Dr Sherie DonLada

## 2017-04-04 NOTE — Telephone Encounter (Signed)
I have not seen patient for over a year Please ask her to schedule a f/u with me soon

## 2017-09-21 ENCOUNTER — Other Ambulatory Visit: Payer: Self-pay | Admitting: Family Medicine

## 2017-09-21 NOTE — Telephone Encounter (Signed)
Pt will call back to schedule

## 2017-09-21 NOTE — Telephone Encounter (Signed)
Patient has not been seen for about a year and a half She needs an appointment

## 2018-03-26 ENCOUNTER — Telehealth: Payer: Self-pay

## 2018-03-26 NOTE — Telephone Encounter (Signed)
Entered in error

## 2019-03-22 ENCOUNTER — Other Ambulatory Visit: Payer: Self-pay

## 2019-03-22 ENCOUNTER — Inpatient Hospital Stay
Admission: EM | Admit: 2019-03-22 | Discharge: 2019-03-24 | DRG: 395 | Disposition: A | Discharge status: Home / Self Care | Payer: Medicaid Other | Attending: Surgery | Admitting: Surgery

## 2019-03-22 ENCOUNTER — Emergency Department: Payer: Medicaid Other

## 2019-03-22 DIAGNOSIS — Z91040 Latex allergy status: Secondary | ICD-10-CM

## 2019-03-22 DIAGNOSIS — Z888 Allergy status to other drugs, medicaments and biological substances status: Secondary | ICD-10-CM

## 2019-03-22 DIAGNOSIS — Z20822 Contact with and (suspected) exposure to covid-19: Secondary | ICD-10-CM | POA: Diagnosis present

## 2019-03-22 DIAGNOSIS — Z91041 Radiographic dye allergy status: Secondary | ICD-10-CM

## 2019-03-22 DIAGNOSIS — Z825 Family history of asthma and other chronic lower respiratory diseases: Secondary | ICD-10-CM

## 2019-03-22 DIAGNOSIS — Z8261 Family history of arthritis: Secondary | ICD-10-CM

## 2019-03-22 DIAGNOSIS — R1084 Generalized abdominal pain: Secondary | ICD-10-CM

## 2019-03-22 DIAGNOSIS — Z841 Family history of disorders of kidney and ureter: Secondary | ICD-10-CM

## 2019-03-22 DIAGNOSIS — Z818 Family history of other mental and behavioral disorders: Secondary | ICD-10-CM

## 2019-03-22 DIAGNOSIS — Z8279 Family history of other congenital malformations, deformations and chromosomal abnormalities: Secondary | ICD-10-CM

## 2019-03-22 DIAGNOSIS — E559 Vitamin D deficiency, unspecified: Secondary | ICD-10-CM | POA: Diagnosis present

## 2019-03-22 DIAGNOSIS — Z87891 Personal history of nicotine dependence: Secondary | ICD-10-CM

## 2019-03-22 DIAGNOSIS — E785 Hyperlipidemia, unspecified: Secondary | ICD-10-CM | POA: Diagnosis present

## 2019-03-22 DIAGNOSIS — Z811 Family history of alcohol abuse and dependence: Secondary | ICD-10-CM

## 2019-03-22 DIAGNOSIS — Z8249 Family history of ischemic heart disease and other diseases of the circulatory system: Secondary | ICD-10-CM

## 2019-03-22 DIAGNOSIS — K436 Other and unspecified ventral hernia with obstruction, without gangrene: Principal | ICD-10-CM | POA: Diagnosis present

## 2019-03-22 DIAGNOSIS — Z823 Family history of stroke: Secondary | ICD-10-CM

## 2019-03-22 DIAGNOSIS — Z886 Allergy status to analgesic agent status: Secondary | ICD-10-CM

## 2019-03-22 DIAGNOSIS — K430 Incisional hernia with obstruction, without gangrene: Secondary | ICD-10-CM | POA: Diagnosis present

## 2019-03-22 DIAGNOSIS — Z881 Allergy status to other antibiotic agents status: Secondary | ICD-10-CM

## 2019-03-22 DIAGNOSIS — E7800 Pure hypercholesterolemia, unspecified: Secondary | ICD-10-CM | POA: Diagnosis present

## 2019-03-22 DIAGNOSIS — Z882 Allergy status to sulfonamides status: Secondary | ICD-10-CM

## 2019-03-22 DIAGNOSIS — N183 Chronic kidney disease, stage 3 unspecified: Secondary | ICD-10-CM | POA: Diagnosis present

## 2019-03-22 DIAGNOSIS — Z8349 Family history of other endocrine, nutritional and metabolic diseases: Secondary | ICD-10-CM

## 2019-03-22 DIAGNOSIS — Z885 Allergy status to narcotic agent status: Secondary | ICD-10-CM

## 2019-03-22 DIAGNOSIS — K439 Ventral hernia without obstruction or gangrene: Secondary | ICD-10-CM | POA: Diagnosis present

## 2019-03-22 DIAGNOSIS — J449 Chronic obstructive pulmonary disease, unspecified: Secondary | ICD-10-CM | POA: Diagnosis present

## 2019-03-22 DIAGNOSIS — I129 Hypertensive chronic kidney disease with stage 1 through stage 4 chronic kidney disease, or unspecified chronic kidney disease: Secondary | ICD-10-CM | POA: Diagnosis present

## 2019-03-22 LAB — CBC
HCT: 44.7 % (ref 36.0–46.0)
Hemoglobin: 14.1 g/dL (ref 12.0–15.0)
MCH: 28.4 pg (ref 26.0–34.0)
MCHC: 31.5 g/dL (ref 30.0–36.0)
MCV: 90.1 fL (ref 80.0–100.0)
Platelets: 264 10*3/uL (ref 150–400)
RBC: 4.96 MIL/uL (ref 3.87–5.11)
RDW: 13.9 % (ref 11.5–15.5)
WBC: 11.2 10*3/uL — ABNORMAL HIGH (ref 4.0–10.5)
nRBC: 0 % (ref 0.0–0.2)

## 2019-03-22 LAB — URINALYSIS, COMPLETE (UACMP) WITH MICROSCOPIC
Bacteria, UA: NONE SEEN
Bilirubin Urine: NEGATIVE
Glucose, UA: NEGATIVE mg/dL
Hgb urine dipstick: NEGATIVE
Ketones, ur: NEGATIVE mg/dL
Leukocytes,Ua: NEGATIVE
Nitrite: NEGATIVE
Protein, ur: NEGATIVE mg/dL
Specific Gravity, Urine: 1.01 (ref 1.005–1.030)
pH: 5 (ref 5.0–8.0)

## 2019-03-22 LAB — RESPIRATORY PANEL BY RT PCR (FLU A&B, COVID)
Influenza A by PCR: NEGATIVE
Influenza B by PCR: NEGATIVE
SARS Coronavirus 2 by RT PCR: NEGATIVE

## 2019-03-22 LAB — COMPREHENSIVE METABOLIC PANEL
ALT: 27 U/L (ref 0–44)
AST: 24 U/L (ref 15–41)
Albumin: 4.4 g/dL (ref 3.5–5.0)
Alkaline Phosphatase: 66 U/L (ref 38–126)
Anion gap: 13 (ref 5–15)
BUN: 25 mg/dL — ABNORMAL HIGH (ref 8–23)
CO2: 24 mmol/L (ref 22–32)
Calcium: 9.6 mg/dL (ref 8.9–10.3)
Chloride: 100 mmol/L (ref 98–111)
Creatinine, Ser: 1.39 mg/dL — ABNORMAL HIGH (ref 0.44–1.00)
GFR calc Af Amer: 47 mL/min — ABNORMAL LOW (ref >60)
GFR calc non Af Amer: 41 mL/min — ABNORMAL LOW (ref >60)
Glucose, Bld: 124 mg/dL — ABNORMAL HIGH (ref 70–99)
Potassium: 4.3 mmol/L (ref 3.5–5.1)
Sodium: 137 mmol/L (ref 135–145)
Total Bilirubin: 0.8 mg/dL (ref 0.3–1.2)
Total Protein: 8.6 g/dL — ABNORMAL HIGH (ref 6.5–8.1)

## 2019-03-22 LAB — LIPASE, BLOOD: Lipase: 35 U/L (ref 11–51)

## 2019-03-22 MED ORDER — IPRATROPIUM-ALBUTEROL 0.5-2.5 (3) MG/3ML IN SOLN
3.0000 mL | RESPIRATORY_TRACT | Status: DC | PRN
  Administered 2019-03-24: 3 mL via RESPIRATORY_TRACT
  Filled 2019-03-22: qty 3

## 2019-03-22 MED ORDER — DIPHENHYDRAMINE HCL 50 MG/ML IJ SOLN
25.0000 mg | Freq: Once | INTRAMUSCULAR | Status: AC
  Administered 2019-03-22: 25 mg via INTRAVENOUS

## 2019-03-22 MED ORDER — CALCIUM CARBONATE ANTACID 500 MG PO CHEW
1.0000 | CHEWABLE_TABLET | Freq: Every day | ORAL | Status: DC
  Administered 2019-03-22 – 2019-03-24 (×3): 200 mg via ORAL
  Filled 2019-03-22 (×3): qty 1

## 2019-03-22 MED ORDER — ALBUTEROL SULFATE (2.5 MG/3ML) 0.083% IN NEBU: 2.5000 mg | INHALATION_SOLUTION | Freq: Four times a day (QID) | RESPIRATORY_TRACT | Status: DC | PRN

## 2019-03-22 MED ORDER — ALBUTEROL SULFATE (2.5 MG/3ML) 0.083% IN NEBU: 3.0000 mL | INHALATION_SOLUTION | RESPIRATORY_TRACT | Status: DC | PRN

## 2019-03-22 MED ORDER — ACETAMINOPHEN 325 MG PO TABS
650.0000 mg | ORAL_TABLET | Freq: Four times a day (QID) | ORAL | Status: DC | PRN
  Administered 2019-03-22 (×2): 650 mg via ORAL
  Filled 2019-03-22 (×2): qty 2

## 2019-03-22 MED ORDER — FLUTICASONE FUROATE-VILANTEROL 200-25 MCG/INH IN AEPB
1.0000 | INHALATION_SPRAY | Freq: Every day | RESPIRATORY_TRACT | Status: DC
  Administered 2019-03-23 – 2019-03-24 (×3): 1 via RESPIRATORY_TRACT
  Filled 2019-03-22 (×2): qty 28

## 2019-03-22 MED ORDER — SODIUM CHLORIDE 0.9 % IV BOLUS
1000.0000 mL | Freq: Once | INTRAVENOUS | Status: AC
  Administered 2019-03-22: 1000 mL via INTRAVENOUS

## 2019-03-22 MED ORDER — BUTALBITAL-APAP-CAFFEINE 50-325-40 MG PO TABS
1.0000 | ORAL_TABLET | ORAL | Status: DC | PRN
  Administered 2019-03-22: 1 via ORAL
  Filled 2019-03-22: qty 1

## 2019-03-22 MED ORDER — ONDANSETRON 4 MG PO TBDP
4.0000 mg | ORAL_TABLET | Freq: Four times a day (QID) | ORAL | Status: DC | PRN
  Administered 2019-03-22 – 2019-03-23 (×2): 4 mg via ORAL
  Filled 2019-03-22 (×2): qty 1

## 2019-03-22 MED ORDER — TIOTROPIUM BROMIDE MONOHYDRATE 18 MCG IN CAPS
18.0000 ug | ORAL_CAPSULE | Freq: Every day | RESPIRATORY_TRACT | Status: DC
  Administered 2019-03-23: 18 ug via RESPIRATORY_TRACT
  Filled 2019-03-22: qty 5

## 2019-03-22 MED ORDER — ONDANSETRON HCL 4 MG/2ML IJ SOLN
4.0000 mg | Freq: Four times a day (QID) | INTRAMUSCULAR | Status: DC | PRN
  Administered 2019-03-22: 4 mg via INTRAVENOUS
  Filled 2019-03-22: qty 2

## 2019-03-22 MED ORDER — ALPRAZOLAM 0.5 MG PO TABS
1.0000 mg | ORAL_TABLET | Freq: Four times a day (QID) | ORAL | Status: DC | PRN
  Administered 2019-03-22 – 2019-03-24 (×6): 1 mg via ORAL
  Filled 2019-03-22 (×6): qty 2

## 2019-03-22 MED ORDER — METOPROLOL SUCCINATE ER 50 MG PO TB24
50.0000 mg | ORAL_TABLET | Freq: Every day | ORAL | Status: DC
  Administered 2019-03-22 – 2019-03-24 (×2): 50 mg via ORAL
  Filled 2019-03-22 (×3): qty 1

## 2019-03-22 MED ORDER — HYDROMORPHONE HCL 1 MG/ML IJ SOLN
1.0000 mg | Freq: Once | INTRAMUSCULAR | Status: AC
  Administered 2019-03-22: 1 mg via INTRAVENOUS
  Filled 2019-03-22: qty 1

## 2019-03-22 MED ORDER — ONDANSETRON HCL 4 MG/2ML IJ SOLN
4.0000 mg | Freq: Once | INTRAMUSCULAR | Status: AC
  Administered 2019-03-22: 4 mg via INTRAVENOUS
  Filled 2019-03-22: qty 2

## 2019-03-22 NOTE — ED Triage Notes (Signed)
Pt to ED via ems from home, pt c/o generalized abd pain that woke her from her sleep. Pt has abd hernia. Pt states she had 1 emesis occurrence as well.

## 2019-03-22 NOTE — H&P (View-Only) (Signed)
Subjective:   CC: Ventral hernia  HPI:  Kylie [name] is a 64 y.o. female who was referred by [facility] for evaluation of above cc.   Symptoms were first noted several hours ago. Pain is sharp and intense.  Associated with lump and, exacerbated by palpation.  lump is not reducible.   Initially came in and reduced by ED provider, but CT scan showed persistent hernia so surgery consulted.    Past Medical History:  has a past medical history of Anemia, Anxiety, Asthma, AVM (arteriovenous malformation) (March 2016), Chronic kidney disease, stage II (mild), CKD (chronic kidney disease) stage 3, GFR 30-59 ml/min, COPD (chronic obstructive pulmonary disease) (HCC), Depression, Diverticulitis, GERD (gastroesophageal reflux disease), Heart murmur, Hiatal hernia, Hypercholesteremia, Hyperlipidemia, Hypertension, Leaky heart valve, Low serum cortisol level (HCC) (06/03/2015), Murmur, Osteopenia determined by x-ray (03/27/2016), Overactive bladder, and Panic attacks.  Past Surgical History:  Past Surgical History:  Procedure Laterality Date  . ABDOMINAL HYSTERECTOMY  2007   Total with BSO  . APPENDECTOMY  2006  . CARDIAC CATHETERIZATION N/A 01/07/2015   Procedure: Left Heart Cath and Coronary Angiography;  Surgeon: Lamar Blinks, MD;  Location: [facility] INVASIVE CV LAB;  Service: Cardiovascular;  Laterality: N/A;  . COLONOSCOPY  2013   Dr Wandalee Ferdinand at [facility]  . SIGMOID RESECTION / RECTOPEXY  10/17/13  . TUBAL LIGATION  1981  . UPPER GI ENDOSCOPY  2013   Dr Wandalee Ferdinand at [facility]    Family History: family history includes Alcohol abuse in her father; Arthritis in her father and sister; Asthma in her sister; Cancer in her mother; Diverticulitis in her father; Down syndrome in her brother; Heart disease in her brother and sister; Hyperlipidemia in her brother, brother, and sister; Hypertension in her brother, brother, daughter, sister, sister, sister, sister, sister, and son; Kidney disease in her mother;  Mental illness in her father; Migraines in her sister; Stroke in her brother.  Social History:  reports that she quit smoking about 8 years ago. Her smoking use included cigarettes. She has a 50.00 pack-year smoking history. She has never used smokeless tobacco. She reports that she does not drink alcohol or use drugs.  Current Medications:  Medications Prior to Admission  Medication Sig Dispense Refill  . albuterol (PROVENTIL) (2.5 MG/3ML) 0.083% nebulizer solution Take 3 mLs (2.5 mg total) by nebulization every 6 (six) hours as needed for wheezing or shortness of breath. 75 mL 1  . ALPRAZolam (XANAX) 1 MG tablet Take 1 mg by mouth in the morning and at bedtime.     . Fluticasone-Salmeterol (ADVAIR DISKUS) 250-50 MCG/DOSE AEPB Inhale 1 puff into the lungs in the morning and at bedtime.    Marland Kitchen ipratropium-albuterol (DUONEB) 0.5-2.5 (3) MG/3ML SOLN Take 3 mLs by nebulization every 4 (four) hours as needed. 360 mL 5  . metoprolol succinate (TOPROL-XL) 50 MG 24 hr tablet TAKE ONE TABLET BY MOUTH DAILY (Patient taking differently: Take 100 mg by mouth daily. ) 7 tablet 0  . PROAIR HFA 108 (90 Base) MCG/ACT inhaler INHALE 2 PUFFS BY MOUTH EVERY 4 HOURS ASNEEDED FOR WHEEZING OR SHORTNESS OF BREATH 18 g 3  . tiotropium (SPIRIVA) 18 MCG inhalation capsule Place 18 mcg into inhaler and inhale daily.      Allergies:  Allergies as of 03/22/2019 - Review Complete 03/22/2019  Allergen Reaction Noted  . Cephalexin Rash 09/08/2014  . Fish allergy Hives 01/06/2015  . Ivp dye [iodinated diagnostic agents] Other (See Comments) and Anaphylaxis 09/30/2013  .  Nitrofurantoin Rash 09/08/2014  . Sulfa antibiotics Hives and Rash 09/30/2013  . Aspirin Other (See Comments) 09/08/2014  . Ciprofloxacin Hives 09/30/2013  . Doxycycline  09/08/2014  . Other Other (See Comments) 09/30/2013  . Oxycodone Other (See Comments) 09/08/2014  . Percocet [oxycodone-acetaminophen] Other (See Comments) 09/30/2013  . Prednisone  Nausea Only 03/27/2016  . Tequin [gatifloxacin] Hives 09/30/2013  . Latex Rash 09/30/2013  . Myrbetriq [mirabegron] Rash 09/08/2014  . Premarin [conjugated estrogens] Rash 09/30/2013    ROS:  General: Denies weight loss, weight gain, fatigue, fevers, chills, and night sweats. Eyes: Denies blurry vision, double vision, eye pain, itchy eyes, and tearing. Ears: Denies hearing loss, earache, and ringing in ears. Nose: Denies sinus pain, congestion, infections, runny nose, and nosebleeds. Mouth/throat: Denies hoarseness, sore throat, bleeding gums, and difficulty swallowing. Heart: Denies chest pain, palpitations, racing heart, irregular heartbeat, leg pain or swelling, and decreased activity tolerance. Respiratory: Denies breathing difficulty, shortness of breath, wheezing, cough, and sputum. GI: Denies change in appetite, heartburn, nausea, vomiting, constipation, diarrhea, and blood in stool. GU: Denies difficulty urinating, pain with urinating, urgency, frequency, blood in urine. Musculoskeletal: Denies joint stiffness, pain, swelling, muscle weakness. Skin: Denies rash, itching, mass, tumors, sores, and boils Neurologic: Denies headache, fainting, dizziness, seizures, numbness, and tingling. Psychiatric: Denies depression, anxiety, difficulty sleeping, and memory loss. Endocrine: Denies heat or cold intolerance, and increased thirst or urination. Blood/lymph: Denies easy bruising, easy bruising, and swollen glands     Objective:     BP 132/60   Pulse 67   Temp 97.6 F (36.4 C) (Oral)   Resp (!) 21   Ht 4\' 10"  (1.473 m)   Wt 78 kg   SpO2 100%   BMI 35.95 kg/m   Constitutional :  alert, cooperative, appears stated age and no distress  Lymphatics/Throat:  no asymmetry, masses, or scars  Respiratory:  clear to auscultation bilaterally  Cardiovascular:  regular rate and rhythm  Gastrointestinal: soft, non-tender; bowel sounds normal; no masses,  no organomegaly. ventral hernia  noted.  Moderate size and tender, but with some palpation, easily reducible with clearly palpable hernia defect edges.  Hernia immediately protrudes again when pressure is released  Musculoskeletal: lying in bed, no obvious difficulty moving upper extremities  Skin: Cool and moist  Psychiatric: Normal affect, non-agitated, not confused       LABS:  CMP Latest Ref Rng & Units 03/22/2019 05/24/2016 03/15/2016  Glucose 70 - 99 mg/dL 124(H) 112(H) 239(H)  BUN 8 - 23 mg/dL 25(H) 17 21(H)  Creatinine 0.44 - 1.00 mg/dL 1.39(H) 1.13(H) 1.09(H)  Sodium 135 - 145 mmol/L 137 140 138  Potassium 3.5 - 5.1 mmol/L 4.3 3.8 4.3  Chloride 98 - 111 mmol/L 100 103 101  CO2 22 - 32 mmol/L 24 31 25   Calcium 8.9 - 10.3 mg/dL 9.6 9.5 9.2  Total Protein 6.5 - 8.1 g/dL 8.6(H) - -  Total Bilirubin 0.3 - 1.2 mg/dL 0.8 - -  Alkaline Phos 38 - 126 U/L 66 - -  AST 15 - 41 U/L 24 - -  ALT 0 - 44 U/L 27 - -   CBC Latest Ref Rng & Units 03/22/2019 05/24/2016 03/15/2016  WBC 4.0 - 10.5 K/uL 11.2(H) 10.6 11.7(H)  Hemoglobin 12.0 - 15.0 g/dL 14.1 13.7 14.6  Hematocrit 36.0 - 46.0 % 44.7 41.7 45.1  Platelets 150 - 400 K/uL 264 260 269    RADS: CLINICAL DATA:  Abdominal distension, vomiting, ventral hernia. Prior hysterectomy, appendectomy, and hernia repair.  EXAM: CT ABDOMEN AND PELVIS WITHOUT CONTRAST  TECHNIQUE: Multidetector CT imaging of the abdomen and pelvis was performed following the standard protocol without IV contrast.  COMPARISON:  08/14/2013  FINDINGS: Lower chest: Stable 5 mm subpleural nodule in the left lower lobe (series 3/image 11), benign.  Hepatobiliary: Hepatic steatosis with focal fatty sparing along the gallbladder fossa.  Layering small gallstone (series 2/image 34), without associated inflammatory changes. No intrahepatic or extrahepatic ductal dilatation.  Pancreas: Within normal limits.  Spleen: Within normal limits.  Adrenals/Urinary Tract: Adrenal glands are within  normal limits.  Kidneys are within normal limits. No renal, ureteral, or bladder calculi. No hydronephrosis.  Bladder is within normal limits.  Stomach/Bowel: Stomach is within normal limits.  Mildly dilated loop of small bowel entering a periumbilical hernia (series 2/image 57). Mildly dilated loop of small bowel exiting the periumbilical hernia with associated small bowel stasis (series 2/image 85). Distally in the right lower quadrant, the small bowel loops are decompressed (series 2/image 70). However, proximally in the left upper abdomen, they remain nondilated (series 2/image 32). Overall, there may be mild partial obstruction (coronal image 19) on the basis of the hernia (described below).  Prior appendectomy.  Scattered colonic diverticulosis, without evidence of diverticulitis.  Vascular/Lymphatic: No evidence of abdominal aortic aneurysm.  Atherosclerotic calcifications of the abdominal aorta and branch vessels.  No suspicious abdominopelvic lymphadenopathy.  Reproductive: Status post hysterectomy.  No adnexal masses.  Other: No abdominopelvic ascites.  Moderate periumbilical hernia containing fat and loops of mildly dilated small bowel (series 2/image 63). Mild stranding along the fat within the hernia (sagittal image 68), raising concern for inflammation, although without discrete fluid. No pneumatosis or free air.  Musculoskeletal: Mild degenerative changes of the visualized thoracolumbar spine.  IMPRESSION: Moderate periumbilical hernia containing fat and loops of mildly dilated small bowel. Some degree of inflammation is present, although without discrete fluid. Correlate for incarceration.  Mild partial small bowel obstruction on the basis of the hernia is suspected. No pneumatosis or free air.  Cholelithiasis, without associated inflammatory changes.  Hepatic steatosis, with focal fatty sparing.   Electronically Signed    By: Sriyesh  Krishnan M.D.   On: 03/22/2019 05:48  Assessment:   Incarcerated ventral hernia.  Reducible with palpation but immediately herniate afterwards. Plan:   Patient denies any nausea vomiting and the pain has improved since initial presentation.  Repeat exams after the initial exam early in the morning is persistently reducible but recurrent ventral hernia.  She is a poor candidate for any surgical intervention due to her end-stage COPD, to the point where she was recommended by her surgeon at [facility] that she should not proceed with any sort of surgery.  We will continue to monitor her to see if she develops any signs of strangulation or obstruction secondary to this persistent hernia that is reducible but recurrent.   

## 2019-03-22 NOTE — ED Notes (Signed)
Report given to Erika, RN.

## 2019-03-22 NOTE — Consult Note (Deleted)
Subjective:   CC: Ventral hernia  HPI:  Kylie [name] is a 64 y.o. female who was referred by [facility] for evaluation of above cc.   Symptoms were first noted several hours ago. Pain is sharp and intense.  Associated with lump and, exacerbated by palpation.  lump is not reducible.   Initially came in and reduced by ED provider, but CT scan showed persistent hernia so surgery consulted.    Past Medical History:  has a past medical history of Anemia, Anxiety, Asthma, AVM (arteriovenous malformation) (March 2016), Chronic kidney disease, stage II (mild), CKD (chronic kidney disease) stage 3, GFR 30-59 ml/min, COPD (chronic obstructive pulmonary disease) (HCC), Depression, Diverticulitis, GERD (gastroesophageal reflux disease), Heart murmur, Hiatal hernia, Hypercholesteremia, Hyperlipidemia, Hypertension, Leaky heart valve, Low serum cortisol level ([place]) (06/03/2015), Murmur, Osteopenia determined by x-ray (03/27/2016), Overactive bladder, and Panic attacks.  Past Surgical History:  Past Surgical History:  Procedure Laterality Date  . ABDOMINAL HYSTERECTOMY  2007   Total with BSO  . APPENDECTOMY  2006  . CARDIAC CATHETERIZATION N/A 01/07/2015   Procedure: Left Heart Cath and Coronary Angiography;  Surgeon: Corey Skains, MD;  Location: [place] CV LAB;  Service: Cardiovascular;  Laterality: N/A;  . COLONOSCOPY  2013   Dr Acquanetta Sit at [place]  . SIGMOID RESECTION / RECTOPEXY  10/17/13  . TUBAL LIGATION  1981  . UPPER GI ENDOSCOPY  2013   Dr Acquanetta Sit at [facility]    Family History: family history includes Alcohol abuse in her father; Arthritis in her father and sister; Asthma in her sister; Cancer in her mother; Diverticulitis in her father; Down syndrome in her brother; Heart disease in her brother and sister; Hyperlipidemia in her brother, brother, and sister; Hypertension in her brother, brother, daughter, sister, sister, sister, sister, sister, and son; Kidney disease in her mother;  Mental illness in her father; Migraines in her sister; Stroke in her brother.  Social History:  reports that she quit smoking about 8 years ago. Her smoking use included cigarettes. She has a 50.00 pack-year smoking history. She has never used smokeless tobacco. She reports that she does not drink alcohol or use drugs.  Current Medications: (Not in a hospital admission)   Allergies:  Allergies as of 03/22/2019 - Review Complete 03/22/2019  Allergen Reaction Noted  . Cephalexin Rash 09/08/2014  . Fish allergy Hives 01/06/2015  . Ivp dye [iodinated diagnostic agents] Other (See Comments) and Anaphylaxis 09/30/2013  . Nitrofurantoin Rash 09/08/2014  . Sulfa antibiotics Hives and Rash 09/30/2013  . Aspirin Other (See Comments) 09/08/2014  . Ciprofloxacin Hives 09/30/2013  . Doxycycline  09/08/2014  . Other Other (See Comments) 09/30/2013  . Oxycodone Other (See Comments) 09/08/2014  . Percocet [oxycodone-acetaminophen] Other (See Comments) 09/30/2013  . Prednisone Nausea Only 03/27/2016  . Tequin [gatifloxacin] Hives 09/30/2013  . Latex Rash 09/30/2013  . Myrbetriq [mirabegron] Rash 09/08/2014  . Premarin [conjugated estrogens] Rash 09/30/2013    ROS:  General: Denies weight loss, weight gain, fatigue, fevers, chills, and night sweats. Eyes: Denies blurry vision, double vision, eye pain, itchy eyes, and tearing. Ears: Denies hearing loss, earache, and ringing in ears. Nose: Denies sinus pain, congestion, infections, runny nose, and nosebleeds. Mouth/throat: Denies hoarseness, sore throat, bleeding gums, and difficulty swallowing. Heart: Denies chest pain, palpitations, racing heart, irregular heartbeat, leg pain or swelling, and decreased activity tolerance. Respiratory: Denies breathing difficulty, shortness of breath, wheezing, cough, and sputum. GI: Denies change in appetite, heartburn, nausea, vomiting, constipation,  diarrhea, and blood in stool. GU: Denies difficulty urinating,  pain with urinating, urgency, frequency, blood in urine. Musculoskeletal: Denies joint stiffness, pain, swelling, muscle weakness. Skin: Denies rash, itching, mass, tumors, sores, and boils Neurologic: Denies headache, fainting, dizziness, seizures, numbness, and tingling. Psychiatric: Denies depression, anxiety, difficulty sleeping, and memory loss. Endocrine: Denies heat or cold intolerance, and increased thirst or urination. Blood/lymph: Denies easy bruising, easy bruising, and swollen glands     Objective:     BP 122/66   Pulse 74   Temp 97.6 F (36.4 C) (Oral)   Resp (!) 26   Ht 4\' 10"  (1.473 m)   Wt 78 kg   SpO2 100%   BMI 35.95 kg/m   Constitutional :  alert, cooperative, appears stated age and no distress  Lymphatics/Throat:  no asymmetry, masses, or scars  Respiratory:  clear to auscultation bilaterally  Cardiovascular:  regular rate and rhythm  Gastrointestinal: soft, non-tender; bowel sounds normal; no masses,  no organomegaly. ventral hernia noted.  Moderate size and tender, but with some palpation, easily reducible with clearly palpable hernia defect edges.  Hernia immediately protrudes again when pressure is released  Musculoskeletal: lying in bed, no obvious difficulty moving upper extremities  Skin: Cool and moist  Psychiatric: Normal affect, non-agitated, not confused       LABS:  CMP Latest Ref Rng & Units 03/22/2019 05/24/2016 03/15/2016  Glucose 70 - 99 mg/dL 05/15/2016) 240(X) 735(H)  BUN 8 - 23 mg/dL 299(M) 17 42(A)  Creatinine 0.44 - 1.00 mg/dL 83(M) 1.96(Q) 2.29(N)  Sodium 135 - 145 mmol/L 137 140 138  Potassium 3.5 - 5.1 mmol/L 4.3 3.8 4.3  Chloride 98 - 111 mmol/L 100 103 101  CO2 22 - 32 mmol/L 24 31 25   Calcium 8.9 - 10.3 mg/dL 9.6 9.5 9.2  Total Protein 6.5 - 8.1 g/dL 9.89(Q) - -  Total Bilirubin 0.3 - 1.2 mg/dL 0.8 - -  Alkaline Phos 38 - 126 U/L 66 - -  AST 15 - 41 U/L 24 - -  ALT 0 - 44 U/L 27 - -   CBC Latest Ref Rng & Units 03/22/2019  05/24/2016 03/15/2016  WBC 4.0 - 10.5 K/uL 11.2(H) 10.6 11.7(H)  Hemoglobin 12.0 - 15.0 g/dL 05/26/2016 05/15/2016 41.7  Hematocrit 36.0 - 46.0 % 44.7 41.7 45.1  Platelets 150 - 400 K/uL 264 260 269    RADS: CLINICAL DATA:  Abdominal distension, vomiting, ventral hernia. Prior hysterectomy, appendectomy, and hernia repair.  EXAM: CT ABDOMEN AND PELVIS WITHOUT CONTRAST  TECHNIQUE: Multidetector CT imaging of the abdomen and pelvis was performed following the standard protocol without IV contrast.  COMPARISON:  08/14/2013  FINDINGS: Lower chest: Stable 5 mm subpleural nodule in the left lower lobe (series 3/image 11), benign.  Hepatobiliary: Hepatic steatosis with focal fatty sparing along the gallbladder fossa.  Layering small gallstone (series 2/image 34), without associated inflammatory changes. No intrahepatic or extrahepatic ductal dilatation.  Pancreas: Within normal limits.  Spleen: Within normal limits.  Adrenals/Urinary Tract: Adrenal glands are within normal limits.  Kidneys are within normal limits. No renal, ureteral, or bladder calculi. No hydronephrosis.  Bladder is within normal limits.  Stomach/Bowel: Stomach is within normal limits.  Mildly dilated loop of small bowel entering a periumbilical hernia (series 2/image 57). Mildly dilated loop of small bowel exiting the periumbilical hernia with associated small bowel stasis (series 2/image 85). Distally in the right lower quadrant, the small bowel loops are decompressed (series 2/image 70). However, proximally in the left  upper abdomen, they remain nondilated (series 2/image 32). Overall, there may be mild partial obstruction (coronal image 19) on the basis of the hernia (described below).  Prior appendectomy.  Scattered colonic diverticulosis, without evidence of diverticulitis.  Vascular/Lymphatic: No evidence of abdominal aortic aneurysm.  Atherosclerotic calcifications of the abdominal  aorta and branch vessels.  No suspicious abdominopelvic lymphadenopathy.  Reproductive: Status post hysterectomy.  No adnexal masses.  Other: No abdominopelvic ascites.  Moderate periumbilical hernia containing fat and loops of mildly dilated small bowel (series 2/image 63). Mild stranding along the fat within the hernia (sagittal image 68), raising concern for inflammation, although without discrete fluid. No pneumatosis or free air.  Musculoskeletal: Mild degenerative changes of the visualized thoracolumbar spine.  IMPRESSION: Moderate periumbilical hernia containing fat and loops of mildly dilated small bowel. Some degree of inflammation is present, although without discrete fluid. Correlate for incarceration.  Mild partial small bowel obstruction on the basis of the hernia is suspected. No pneumatosis or free air.  Cholelithiasis, without associated inflammatory changes.  Hepatic steatosis, with focal fatty sparing.   Electronically Signed   By: Charline Bills M.D.   On: 03/22/2019 05:48  Assessment:   Incarcerated ventral hernia.  Reducible with palpation but immediately herniate afterwards. Plan:   Patient denies any nausea vomiting and the pain has improved since initial presentation.  Repeat exams after the initial exam early in the morning is persistently reducible but recurrent ventral hernia.  She is a poor candidate for any surgical intervention due to her end-stage COPD, to the point where she was recommended by her surgeon at [facility] that she should not proceed with any sort of surgery.  We will continue to monitor her to see if she develops any signs of strangulation or obstruction secondary to this persistent hernia that is reducible but recurrent.

## 2019-03-22 NOTE — ED Provider Notes (Signed)
[facility] Emergency Department Provider Note  ____________________________________________  Time seen: Approximately 5:17 AM  I have reviewed the triage vital signs and the nursing notes.   HISTORY  Chief Complaint Abdominal Pain    HPI Kylie [name] is a 64 y.o. female with a history of anemia, anxiety, CKD, COPD, hypertension, ventral hernia who comes the ED complaining of generalized abdominal pain that woke her up from sleep this morning.  Nonradiating, severe, no aggravating or alleviating factors.  Associated vomiting.  Reports normal bowel movements.      Past Medical History:  Diagnosis Date  . Anemia   . Anxiety    followed by Dr. Janeece RiggersSu  . Asthma   . 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 Chronic kidney disease, stage II (mild)   . CKD (chronic kidney disease) stage 3, GFR 30-59 ml/min   . COPD (chronic obstructive pulmonary disease) (HCC)   . Depression    followed by Dr. Janeece RiggersSu  . Diverticulitis    hospitalized several times  . GERD (gastroesophageal reflux disease)   . Heart murmur   . Hiatal hernia   . Hypercholesteremia   . Hyperlipidemia   . Hypertension   . Leaky heart valve   . Low serum cortisol level (HCC) 06/03/2015  . Murmur   . Osteopenia determined by x-ray 03/27/2016  . Overactive bladder   . Panic attacks    followed by Dr. Janeece RiggersSu     Patient Active Problem List   Diagnosis Date Noted  . Incisional hernia with obstruction 03/22/2019  . Osteopenia determined by x-ray 03/27/2016  . COPD (chronic obstructive pulmonary disease) (HCC) 03/15/2016  . COPD exacerbation (HCC) 03/14/2016  . Medication monitoring encounter 03/07/2016  . Low serum cortisol level (HCC) 06/03/2015  . Vitamin B12 deficiency 06/03/2015  . Hyperlipidemia LDL goal <70 06/03/2015  . Abnormal weight gain 05/20/2015  . Vitamin D deficiency 05/20/2015  . Breast cancer screening 05/20/2015  . IFG (impaired fasting glucose)  02/09/2015  . Striae 02/09/2015  . Elevated red blood cell count 02/09/2015  . Weight gain, abnormal 02/09/2015  . Unstable angina (HCC) 01/07/2015  . Angina at rest [facility]HCC) 01/06/2015  . Vascular calcification 12/29/2014  . Heart murmur 12/29/2014  . Hx of angina pectoris 12/29/2014  . Facial numbness 12/29/2014  . Right arm numbness 12/29/2014  . Right leg numbness 12/29/2014  . Xanthelasma of eyelid 10/09/2014  . AVM (arteriovenous malformation) 09/16/2014  . Essential hypertension, benign 09/16/2014  . Onycholysis of toenail 09/16/2014  . Vaginal atrophy 09/16/2014  . Bilateral hand pain 09/16/2014  . Chronic kidney disease, stage II (mild)   . Hypercholesteremia   . Sigmoid diverticulitis 09/30/2013     Past Surgical History:  Procedure Laterality Date  . ABDOMINAL HYSTERECTOMY  2007   Total with BSO  . APPENDECTOMY  2006  . CARDIAC CATHETERIZATION N/A 01/07/2015   Procedure: Left Heart Cath and Coronary Angiography;  Surgeon: Lamar BlinksBruce J Kowalski, MD;  Location: [facility] INVASIVE CV LAB;  Service: Cardiovascular;  Laterality: N/A;  . COLONOSCOPY  2013   [facility] W[facility]alee [facility] Ganem [facility]  . SIGMOID RESECTION / RECTOPEXY  10/17/13  . TUBAL LIGATION  1981  . UPPER GI ENDOSCOPY  2013   [facility] Ganem [facility]     Prior to Admission medications   Medication Sig Start Date End Date Taking? Authorizing Provider  albuterol (PROVENTIL) (2.5 MG/3ML) 0.083% nebulizer solution Take 3 mLs (2.5 mg total)  by nebulization every 6 (six) hours as needed for wheezing or shortness of breath. 03/27/16   Kerman Passey, MD  ALPRAZolam Prudy Feeler) 1 MG tablet Take 1 mg by mouth 4 (four) times daily as needed.  09/02/13   [provider]  calcium carbonate (TUMS - DOSED IN MG ELEMENTAL CALCIUM) 500 MG chewable tablet Chew 1 tablet by mouth daily.    [provider]  ipratropium-albuterol (DUONEB) 0.5-2.5 (3) MG/3ML SOLN Take 3 mLs by nebulization every 4 (four) hours as needed. 07/06/16    Merwyn Katos, MD  metoprolol succinate (TOPROL-XL) 50 MG 24 hr tablet TAKE ONE TABLET BY MOUTH DAILY 09/21/17   Lada, Janit Bern, MD  PROAIR HFA 108 [phone] Base) MCG/ACT inhaler INHALE 2 PUFFS BY MOUTH EVERY 4 HOURS ASNEEDED FOR WHEEZING OR SHORTNESS OF BREATH 05/15/16   Merwyn Katos, MD     Allergies Cephalexin, Fish allergy, Ivp dye [iodinated diagnostic agents], Nitrofurantoin, Sulfa antibiotics, Aspirin, Ciprofloxacin, Doxycycline, Other, Oxycodone, Percocet [oxycodone-acetaminophen], Prednisone, Tequin [gatifloxacin], Latex, Myrbetriq [mirabegron], and Premarin [conjugated estrogens]   Family History  Problem Relation Age of Onset  . Cancer Mother        colon  . Kidney disease Mother   . Arthritis Father   . Alcohol abuse Father   . Mental illness Father   . Diverticulitis Father   . Asthma Sister   . Hyperlipidemia Sister   . Hypertension Sister   . Migraines Sister   . Heart disease Brother   . Hyperlipidemia Brother   . Hypertension Brother   . Stroke Brother   . Hypertension Daughter   . Hypertension Son   . Hypertension Sister   . Hypertension Sister   . Hypertension Sister   . Arthritis Sister   . Heart disease Sister   . Hypertension Sister   . Hypertension Brother   . Hyperlipidemia Brother   . Down syndrome Brother     Social History Social History   Tobacco Use  . Smoking status: Former Smoker    Packs/day: 2.00    Years: 25.00    Pack years: 50.00    Types: Cigarettes    Quit date: 01/10/2011    Years since quitting: 8.2  . Smokeless tobacco: Never Used  Substance Use Topics  . Alcohol use: No    Alcohol/week: 0.0 standard drinks  . Drug use: No    Review of Systems  Constitutional:   No fever or chills.  ENT:   No sore throat. No rhinorrhea. Cardiovascular:   No chest pain or syncope. Respiratory:   No dyspnea or cough. Gastrointestinal:   Positive abdominal pain and vomiting as above. Musculoskeletal:   Negative for focal pain or  swelling All other systems reviewed and are negative except as documented above in ROS and HPI.  ____________________________________________   PHYSICAL EXAM:  VITAL SIGNS: ED Triage Vitals  Enc Vitals Group     BP 03/22/19 0428 (!) 191/94     Pulse Rate 03/22/19 0428 67     Resp 03/22/19 0428 16     Temp 03/22/19 0428 97.6 F (36.4 C)     Temp Source 03/22/19 0428 Oral     SpO2 03/22/19 0428 98 %     Weight 03/22/19 0426 172 lb (78 kg)     Height 03/22/19 0426 4\' 10"  (1.473 m)     Head Circumference --      Peak Flow --      Pain Score --  Pain Loc --      Pain Edu? --      Excl. in GC? --     Vital signs reviewed, nursing assessments reviewed.   Constitutional:   Alert and oriented. Non-toxic appearance. Eyes:   Conjunctivae are normal. EOMI. PERRL. ENT      Head:   Normocephalic and atraumatic.      Nose:   Wearing a mask.      Mouth/Throat:   Wearing a mask.      Neck:   No meningismus. Full ROM. Hematological/Lymphatic/Immunilogical:   No cervical lymphadenopathy. Cardiovascular:   RRR. Symmetric bilateral radial and DP pulses.  No murmurs. Cap refill less than 2 seconds. Respiratory:   Normal respiratory effort without tachypnea/retractions. Breath sounds are clear and equal bilaterally. No wheezes/rales/rhonchi. Gastrointestinal:   Soft with mild generalized tenderness.  There is a 3 cm infraumbilical ventral hernia it appears incarcerated, tender to the touch.  This was reduced with firm pressure and manipulation on exam.. No rebound, rigidity, or guarding. Musculoskeletal:   Normal range of motion in all extremities. No joint effusions.  No lower extremity tenderness.  No edema. Neurologic:   Normal speech and language.  Motor grossly intact. No acute focal neurologic deficits are appreciated.  Skin:    Skin is warm, dry and intact. No rash noted.  No petechiae, purpura, or bullae.  ____________________________________________    LABS (pertinent  positives/negatives) (all labs ordered are listed, but only abnormal results are displayed) Labs Reviewed  COMPREHENSIVE METABOLIC PANEL - Abnormal; Notable for the following components:      Result Value   Glucose, Bld 124 (*)    BUN 25 (*)    Creatinine, Ser 1.39 (*)    Total Protein 8.6 (*)    GFR calc non Af Amer 41 (*)    GFR calc Af Amer 47 (*)    All other components within normal limits  CBC - Abnormal; Notable for the following components:   WBC 11.2 (*)    All other components within normal limits  RESPIRATORY PANEL BY RT PCR (FLU A&B, COVID)  LIPASE, BLOOD  URINALYSIS, COMPLETE (UACMP) WITH MICROSCOPIC   ____________________________________________   EKG  Interpreted by me Sinus rhythm rate of 68, normal axis and intervals.  Normal QRS ST segments and T waves.  ____________________________________________    RADIOLOGY  CT ABDOMEN PELVIS WO CONTRAST  Result Date: 03/22/2019 CLINICAL DATA:  Abdominal distension, vomiting, ventral hernia. Prior hysterectomy, appendectomy, and hernia repair. EXAM: CT ABDOMEN AND PELVIS WITHOUT CONTRAST TECHNIQUE: Multidetector CT imaging of the abdomen and pelvis was performed following the standard protocol without IV contrast. COMPARISON:  08/14/2013 FINDINGS: Lower chest: Stable 5 mm subpleural nodule in the left lower lobe (series 3/image 11), benign. Hepatobiliary: Hepatic steatosis with focal fatty sparing along the gallbladder fossa. Layering small gallstone (series 2/image 34), without associated inflammatory changes. No intrahepatic or extrahepatic ductal dilatation. Pancreas: Within normal limits. Spleen: Within normal limits. Adrenals/Urinary Tract: Adrenal glands are within normal limits. Kidneys are within normal limits. No renal, ureteral, or bladder calculi. No hydronephrosis. Bladder is within normal limits. Stomach/Bowel: Stomach is within normal limits. Mildly dilated loop of small bowel entering a periumbilical hernia  (series 2/image 57). Mildly dilated loop of small bowel exiting the periumbilical hernia with associated small bowel stasis (series 2/image 85). Distally in the right lower quadrant, the small bowel loops are decompressed (series 2/image 70). However, proximally in the left upper abdomen, they remain nondilated (series 2/image 32). Overall,  there may be mild partial obstruction (coronal image 19) on the basis of the hernia (described below). Prior appendectomy. Scattered colonic diverticulosis, without evidence of diverticulitis. Vascular/Lymphatic: No evidence of abdominal aortic aneurysm. Atherosclerotic calcifications of the abdominal aorta and branch vessels. No suspicious abdominopelvic lymphadenopathy. Reproductive: Status post hysterectomy. No adnexal masses. Other: No abdominopelvic ascites. Moderate periumbilical hernia containing fat and loops of mildly dilated small bowel (series 2/image 63). Mild stranding along the fat within the hernia (sagittal image 68), raising concern for inflammation, although without discrete fluid. No pneumatosis or free air. Musculoskeletal: Mild degenerative changes of the visualized thoracolumbar spine. IMPRESSION: Moderate periumbilical hernia containing fat and loops of mildly dilated small bowel. Some degree of inflammation is present, although without discrete fluid. Correlate for incarceration. Mild partial small bowel obstruction on the basis of the hernia is suspected. No pneumatosis or free air. Cholelithiasis, without associated inflammatory changes. Hepatic steatosis, with focal fatty sparing. Electronically Signed   By: Julian Hy M.D.   On: 03/22/2019 05:48    ____________________________________________   PROCEDURES Procedures  ____________________________________________  DIFFERENTIAL DIAGNOSIS   Small bowel obstruction, incarcerated hernia, diverticulitis, enteritis, pancreatitis  CLINICAL IMPRESSION / ASSESSMENT AND PLAN / ED  COURSE  Medications ordered in the ED: Medications  sodium chloride 0.9 % bolus 1,000 mL (1,000 mLs Intravenous New Bag/Given 03/22/19 0529)  HYDROmorphone (DILAUDID) injection 1 mg (1 mg Intravenous Given 03/22/19 0523)  ondansetron (ZOFRAN) injection 4 mg (4 mg Intravenous Given 03/22/19 0524)  diphenhydrAMINE (BENADRYL) injection 25 mg (25 mg Intravenous Given 03/22/19 0545)    Pertinent labs & imaging results that were available during my care of the patient were reviewed by me and considered in my medical decision making (see chart for details).  LYNNDA WIERSMA was evaluated in Emergency Department on 03/22/2019 for the symptoms described in the history of present illness. She was evaluated in the context of the global COVID-19 pandemic, which necessitated consideration that the patient might be at risk for infection with the SARS-CoV-2 virus that causes COVID-19. Institutional protocols and algorithms that pertain to the evaluation of patients at risk for COVID-19 are in a state of rapid change based on information released by regulatory bodies including the CDC and federal and state organizations. These policies and algorithms were followed during the patient's care in the ED.   Patient presents with abdominal pain that woke her up from sleep.  On exam she has what appears to be incarcerated ventral hernia, which I reduced on initial assessment.  Patient reports the pain was not resolved by reducing the hernia, and with abdominal distention and vomiting, biggest worry is for SBO.  Will obtain CT scan without contrast due to her anaphylactic contrast allergy, check labs.  Give IV fluids for hydration, Dilaudid 1 mg IV for pain control, Zofran 4 mg IV for nausea relief.  ----------------------------------------- 6:59 AM on 03/22/2019 -----------------------------------------  CT suggests possible persistent hernia, partial SBO. D/w Dr. Lysle Pearl who eval'd pt, agrees that hernia appears soft and  reduced.  recs not placing NGT right now, but will admit for serial exams to ensure hernia does not recur and become incarcerated.      ____________________________________________   FINAL CLINICAL IMPRESSION(S) / ED DIAGNOSES    Final diagnoses:  Generalized abdominal pain  Ventral hernia without obstruction or gangrene     ED Discharge Orders    None      Portions of this note were generated with dragon dictation software. Dictation errors may occur despite  best attempts at proofreading.   Sharman Cheek, MD 03/22/19 0700

## 2019-03-22 NOTE — ED Notes (Signed)
Assisted pt to toilet to void- pt ambulated with steady gait and no assistance needed

## 2019-03-22 NOTE — H&P (Signed)
Subjective:   CC: Ventral hernia  HPI:  Kylie [name] is a 64 y.o. female who was referred by [facility] for evaluation of above cc.   Symptoms were first noted several hours ago. Pain is sharp and intense.  Associated with lump and, exacerbated by palpation.  lump is not reducible.   Initially came in and reduced by ED provider, but CT scan showed persistent hernia so surgery consulted.    Past Medical History:  has a past medical history of Anemia, Anxiety, Asthma, AVM (arteriovenous malformation) (March 2016), Chronic kidney disease, stage II (mild), CKD (chronic kidney disease) stage 3, GFR 30-59 ml/min, COPD (chronic obstructive pulmonary disease) (HCC), Depression, Diverticulitis, GERD (gastroesophageal reflux disease), Heart murmur, Hiatal hernia, Hypercholesteremia, Hyperlipidemia, Hypertension, Leaky heart valve, Low serum cortisol level (HCC) (06/03/2015), Murmur, Osteopenia determined by x-ray (03/27/2016), Overactive bladder, and Panic attacks.  Past Surgical History:  Past Surgical History:  Procedure Laterality Date  . ABDOMINAL HYSTERECTOMY  2007   Total with BSO  . APPENDECTOMY  2006  . CARDIAC CATHETERIZATION N/A 01/07/2015   Procedure: Left Heart Cath and Coronary Angiography;  Surgeon: Lamar Blinks, MD;  Location: [facility] INVASIVE CV LAB;  Service: Cardiovascular;  Laterality: N/A;  . COLONOSCOPY  2013   Dr Wandalee Ferdinand at [facility]  . SIGMOID RESECTION / RECTOPEXY  10/17/13  . TUBAL LIGATION  1981  . UPPER GI ENDOSCOPY  2013   Dr Wandalee Ferdinand at [facility]    Family History: family history includes Alcohol abuse in her father; Arthritis in her father and sister; Asthma in her sister; Cancer in her mother; Diverticulitis in her father; Down syndrome in her brother; Heart disease in her brother and sister; Hyperlipidemia in her brother, brother, and sister; Hypertension in her brother, brother, daughter, sister, sister, sister, sister, sister, and son; Kidney disease in her mother;  Mental illness in her father; Migraines in her sister; Stroke in her brother.  Social History:  reports that she quit smoking about 8 years ago. Her smoking use included cigarettes. She has a 50.00 pack-year smoking history. She has never used smokeless tobacco. She reports that she does not drink alcohol or use drugs.  Current Medications:  Medications Prior to Admission  Medication Sig Dispense Refill  . albuterol (PROVENTIL) (2.5 MG/3ML) 0.083% nebulizer solution Take 3 mLs (2.5 mg total) by nebulization every 6 (six) hours as needed for wheezing or shortness of breath. 75 mL 1  . ALPRAZolam (XANAX) 1 MG tablet Take 1 mg by mouth in the morning and at bedtime.     . Fluticasone-Salmeterol (ADVAIR DISKUS) 250-50 MCG/DOSE AEPB Inhale 1 puff into the lungs in the morning and at bedtime.    Marland Kitchen ipratropium-albuterol (DUONEB) 0.5-2.5 (3) MG/3ML SOLN Take 3 mLs by nebulization every 4 (four) hours as needed. 360 mL 5  . metoprolol succinate (TOPROL-XL) 50 MG 24 hr tablet TAKE ONE TABLET BY MOUTH DAILY (Patient taking differently: Take 100 mg by mouth daily. ) 7 tablet 0  . PROAIR HFA 108 (90 Base) MCG/ACT inhaler INHALE 2 PUFFS BY MOUTH EVERY 4 HOURS ASNEEDED FOR WHEEZING OR SHORTNESS OF BREATH 18 g 3  . tiotropium (SPIRIVA) 18 MCG inhalation capsule Place 18 mcg into inhaler and inhale daily.      Allergies:  Allergies as of 03/22/2019 - Review Complete 03/22/2019  Allergen Reaction Noted  . Cephalexin Rash 09/08/2014  . Fish allergy Hives 01/06/2015  . Ivp dye [iodinated diagnostic agents] Other (See Comments) and Anaphylaxis 09/30/2013  .  Nitrofurantoin Rash 09/08/2014  . Sulfa antibiotics Hives and Rash 09/30/2013  . Aspirin Other (See Comments) 09/08/2014  . Ciprofloxacin Hives 09/30/2013  . Doxycycline  09/08/2014  . Other Other (See Comments) 09/30/2013  . Oxycodone Other (See Comments) 09/08/2014  . Percocet [oxycodone-acetaminophen] Other (See Comments) 09/30/2013  . Prednisone  Nausea Only 03/27/2016  . Tequin [gatifloxacin] Hives 09/30/2013  . Latex Rash 09/30/2013  . Myrbetriq [mirabegron] Rash 09/08/2014  . Premarin [conjugated estrogens] Rash 09/30/2013    ROS:  General: Denies weight loss, weight gain, fatigue, fevers, chills, and night sweats. Eyes: Denies blurry vision, double vision, eye pain, itchy eyes, and tearing. Ears: Denies hearing loss, earache, and ringing in ears. Nose: Denies sinus pain, congestion, infections, runny nose, and nosebleeds. Mouth/throat: Denies hoarseness, sore throat, bleeding gums, and difficulty swallowing. Heart: Denies chest pain, palpitations, racing heart, irregular heartbeat, leg pain or swelling, and decreased activity tolerance. Respiratory: Denies breathing difficulty, shortness of breath, wheezing, cough, and sputum. GI: Denies change in appetite, heartburn, nausea, vomiting, constipation, diarrhea, and blood in stool. GU: Denies difficulty urinating, pain with urinating, urgency, frequency, blood in urine. Musculoskeletal: Denies joint stiffness, pain, swelling, muscle weakness. Skin: Denies rash, itching, mass, tumors, sores, and boils Neurologic: Denies headache, fainting, dizziness, seizures, numbness, and tingling. Psychiatric: Denies depression, anxiety, difficulty sleeping, and memory loss. Endocrine: Denies heat or cold intolerance, and increased thirst or urination. Blood/lymph: Denies easy bruising, easy bruising, and swollen glands     Objective:     BP 132/60   Pulse 67   Temp 97.6 F (36.4 C) (Oral)   Resp (!) 21   Ht 4\' 10"  (1.473 m)   Wt 78 kg   SpO2 100%   BMI 35.95 kg/m   Constitutional :  alert, cooperative, appears stated age and no distress  Lymphatics/Throat:  no asymmetry, masses, or scars  Respiratory:  clear to auscultation bilaterally  Cardiovascular:  regular rate and rhythm  Gastrointestinal: soft, non-tender; bowel sounds normal; no masses,  no organomegaly. ventral hernia  noted.  Moderate size and tender, but with some palpation, easily reducible with clearly palpable hernia defect edges.  Hernia immediately protrudes again when pressure is released  Musculoskeletal: lying in bed, no obvious difficulty moving upper extremities  Skin: Cool and moist  Psychiatric: Normal affect, non-agitated, not confused       LABS:  CMP Latest Ref Rng & Units 03/22/2019 05/24/2016 03/15/2016  Glucose 70 - 99 mg/dL 124(H) 112(H) 239(H)  BUN 8 - 23 mg/dL 25(H) 17 21(H)  Creatinine 0.44 - 1.00 mg/dL 1.39(H) 1.13(H) 1.09(H)  Sodium 135 - 145 mmol/L 137 140 138  Potassium 3.5 - 5.1 mmol/L 4.3 3.8 4.3  Chloride 98 - 111 mmol/L 100 103 101  CO2 22 - 32 mmol/L 24 31 25   Calcium 8.9 - 10.3 mg/dL 9.6 9.5 9.2  Total Protein 6.5 - 8.1 g/dL 8.6(H) - -  Total Bilirubin 0.3 - 1.2 mg/dL 0.8 - -  Alkaline Phos 38 - 126 U/L 66 - -  AST 15 - 41 U/L 24 - -  ALT 0 - 44 U/L 27 - -   CBC Latest Ref Rng & Units 03/22/2019 05/24/2016 03/15/2016  WBC 4.0 - 10.5 K/uL 11.2(H) 10.6 11.7(H)  Hemoglobin 12.0 - 15.0 g/dL 14.1 13.7 14.6  Hematocrit 36.0 - 46.0 % 44.7 41.7 45.1  Platelets 150 - 400 K/uL 264 260 269    RADS: CLINICAL DATA:  Abdominal distension, vomiting, ventral hernia. Prior hysterectomy, appendectomy, and hernia repair.  EXAM: CT ABDOMEN AND PELVIS WITHOUT CONTRAST  TECHNIQUE: Multidetector CT imaging of the abdomen and pelvis was performed following the standard protocol without IV contrast.  COMPARISON:  08/14/2013  FINDINGS: Lower chest: Stable 5 mm subpleural nodule in the left lower lobe (series 3/image 11), benign.  Hepatobiliary: Hepatic steatosis with focal fatty sparing along the gallbladder fossa.  Layering small gallstone (series 2/image 34), without associated inflammatory changes. No intrahepatic or extrahepatic ductal dilatation.  Pancreas: Within normal limits.  Spleen: Within normal limits.  Adrenals/Urinary Tract: Adrenal glands are within  normal limits.  Kidneys are within normal limits. No renal, ureteral, or bladder calculi. No hydronephrosis.  Bladder is within normal limits.  Stomach/Bowel: Stomach is within normal limits.  Mildly dilated loop of small bowel entering a periumbilical hernia (series 2/image 57). Mildly dilated loop of small bowel exiting the periumbilical hernia with associated small bowel stasis (series 2/image 85). Distally in the right lower quadrant, the small bowel loops are decompressed (series 2/image 70). However, proximally in the left upper abdomen, they remain nondilated (series 2/image 32). Overall, there may be mild partial obstruction (coronal image 19) on the basis of the hernia (described below).  Prior appendectomy.  Scattered colonic diverticulosis, without evidence of diverticulitis.  Vascular/Lymphatic: No evidence of abdominal aortic aneurysm.  Atherosclerotic calcifications of the abdominal aorta and branch vessels.  No suspicious abdominopelvic lymphadenopathy.  Reproductive: Status post hysterectomy.  No adnexal masses.  Other: No abdominopelvic ascites.  Moderate periumbilical hernia containing fat and loops of mildly dilated small bowel (series 2/image 63). Mild stranding along the fat within the hernia (sagittal image 68), raising concern for inflammation, although without discrete fluid. No pneumatosis or free air.  Musculoskeletal: Mild degenerative changes of the visualized thoracolumbar spine.  IMPRESSION: Moderate periumbilical hernia containing fat and loops of mildly dilated small bowel. Some degree of inflammation is present, although without discrete fluid. Correlate for incarceration.  Mild partial small bowel obstruction on the basis of the hernia is suspected. No pneumatosis or free air.  Cholelithiasis, without associated inflammatory changes.  Hepatic steatosis, with focal fatty sparing.   Electronically Signed    By: Charline Bills M.D.   On: 03/22/2019 05:48  Assessment:   Incarcerated ventral hernia.  Reducible with palpation but immediately herniate afterwards. Plan:   Patient denies any nausea vomiting and the pain has improved since initial presentation.  Repeat exams after the initial exam early in the morning is persistently reducible but recurrent ventral hernia.  She is a poor candidate for any surgical intervention due to her end-stage COPD, to the point where she was recommended by her surgeon at [facility] that she should not proceed with any sort of surgery.  We will continue to monitor her to see if she develops any signs of strangulation or obstruction secondary to this persistent hernia that is reducible but recurrent.

## 2019-03-22 NOTE — ED Notes (Signed)
Pt awaken. Pt is A&Ox4 and NAD at this time. Pt ambulated to the bathroom without assistance.   Pt c/o nausea and mild headache. 4/10 pain. See medication orders.

## 2019-03-22 NOTE — ED Notes (Signed)
Pt was given pain meds immediately prior to CT scan, post CT scan pt has raised red blisters on inner arm, same side as IV. Pt was given 25mg  IV benadryl for this reaction. Pt denies any complaints.

## 2019-03-23 LAB — HIV ANTIBODY (ROUTINE TESTING W REFLEX): HIV Screen 4th Generation wRfx: NONREACTIVE

## 2019-03-23 NOTE — Plan of Care (Signed)
Continuing with plan of care. 

## 2019-03-24 DIAGNOSIS — Z818 Family history of other mental and behavioral disorders: Secondary | ICD-10-CM | POA: Diagnosis not present

## 2019-03-24 DIAGNOSIS — Z886 Allergy status to analgesic agent status: Secondary | ICD-10-CM | POA: Diagnosis not present

## 2019-03-24 DIAGNOSIS — Z885 Allergy status to narcotic agent status: Secondary | ICD-10-CM | POA: Diagnosis not present

## 2019-03-24 DIAGNOSIS — Z882 Allergy status to sulfonamides status: Secondary | ICD-10-CM | POA: Diagnosis not present

## 2019-03-24 DIAGNOSIS — Z825 Family history of asthma and other chronic lower respiratory diseases: Secondary | ICD-10-CM | POA: Diagnosis not present

## 2019-03-24 DIAGNOSIS — Z811 Family history of alcohol abuse and dependence: Secondary | ICD-10-CM | POA: Diagnosis not present

## 2019-03-24 DIAGNOSIS — Z91041 Radiographic dye allergy status: Secondary | ICD-10-CM | POA: Diagnosis not present

## 2019-03-24 DIAGNOSIS — N183 Chronic kidney disease, stage 3 unspecified: Secondary | ICD-10-CM | POA: Diagnosis present

## 2019-03-24 DIAGNOSIS — Z8349 Family history of other endocrine, nutritional and metabolic diseases: Secondary | ICD-10-CM | POA: Diagnosis not present

## 2019-03-24 DIAGNOSIS — K430 Incisional hernia with obstruction, without gangrene: Secondary | ICD-10-CM | POA: Diagnosis not present

## 2019-03-24 DIAGNOSIS — Z888 Allergy status to other drugs, medicaments and biological substances status: Secondary | ICD-10-CM | POA: Diagnosis not present

## 2019-03-24 DIAGNOSIS — E559 Vitamin D deficiency, unspecified: Secondary | ICD-10-CM | POA: Diagnosis present

## 2019-03-24 DIAGNOSIS — Z8261 Family history of arthritis: Secondary | ICD-10-CM | POA: Diagnosis not present

## 2019-03-24 DIAGNOSIS — Z841 Family history of disorders of kidney and ureter: Secondary | ICD-10-CM | POA: Diagnosis not present

## 2019-03-24 DIAGNOSIS — Z20822 Contact with and (suspected) exposure to covid-19: Secondary | ICD-10-CM | POA: Diagnosis present

## 2019-03-24 DIAGNOSIS — K436 Other and unspecified ventral hernia with obstruction, without gangrene: Secondary | ICD-10-CM | POA: Diagnosis not present

## 2019-03-24 DIAGNOSIS — Z91040 Latex allergy status: Secondary | ICD-10-CM | POA: Diagnosis not present

## 2019-03-24 DIAGNOSIS — Z8249 Family history of ischemic heart disease and other diseases of the circulatory system: Secondary | ICD-10-CM | POA: Diagnosis not present

## 2019-03-24 DIAGNOSIS — Z8279 Family history of other congenital malformations, deformations and chromosomal abnormalities: Secondary | ICD-10-CM | POA: Diagnosis not present

## 2019-03-24 DIAGNOSIS — Z881 Allergy status to other antibiotic agents status: Secondary | ICD-10-CM | POA: Diagnosis not present

## 2019-03-24 DIAGNOSIS — I129 Hypertensive chronic kidney disease with stage 1 through stage 4 chronic kidney disease, or unspecified chronic kidney disease: Secondary | ICD-10-CM | POA: Diagnosis present

## 2019-03-24 DIAGNOSIS — E785 Hyperlipidemia, unspecified: Secondary | ICD-10-CM | POA: Diagnosis present

## 2019-03-24 DIAGNOSIS — J449 Chronic obstructive pulmonary disease, unspecified: Secondary | ICD-10-CM | POA: Diagnosis present

## 2019-03-24 DIAGNOSIS — Z823 Family history of stroke: Secondary | ICD-10-CM | POA: Diagnosis not present

## 2019-03-24 DIAGNOSIS — E7800 Pure hypercholesterolemia, unspecified: Secondary | ICD-10-CM | POA: Diagnosis present

## 2019-03-24 DIAGNOSIS — K439 Ventral hernia without obstruction or gangrene: Secondary | ICD-10-CM | POA: Diagnosis present

## 2019-03-24 NOTE — Progress Notes (Signed)
Discharge order received. Patient mental status is at baseline. Vital signs stable . No signs of acute distress. Discharge instructions given. Patient verbalized understanding. No other issues noted at this time.   

## 2019-03-26 NOTE — Discharge Summary (Addendum)
Physician Discharge Summary  Patient ID: Kylie [name] MRN: [id number] DOB/AGE: 03/13/1955 64 y.o.  Admit date: 03/22/2019 Discharge date: 03/24/2019  Admission Diagnoses: ventral hernia  Discharge Diagnoses:  Same as above  Discharged Condition: good  Hospital Course: Admitted for above.  Hernia was reduced in the emergency department by the ED provider, but due to the persistent pain in the area she was placed under close observation.  Throughout her entire stay the hernia remains easily reducible, but with persistent tenderness to palpation until day of discharge.  By the time of discharge since the pain has significantly improved, but has not completely resolved.  She otherwise is tolerating a diet and denies any nausea or vomiting.   After an extensive discussion of the plans moving forward, decided to proceed with a elective ventral hernia repair with mesh understanding the fact that she is at a very high risk of perioperative complications and recurrence of this.  Discussion was had with patient is well as the husband in the room, they both verbalized understanding and agree with the plan moving forward.  We will arrange elective surgery on an outpatient basis, since at this time no further intervention is necessary nor required as an inpatient.  Consults: None  Discharge Exam: Blood pressure (!) 148/90, pulse 98, temperature 98.3 F (36.8 C), temperature source Oral, resp. rate 20, height 4\' 10"  (1.473 m), weight 78 kg, SpO2 100 %. General appearance: alert, cooperative and no distress GI: Soft, no guarding, persistently reducible ventral hernia contents but recurs right away.  Soon as pressures released.  Very minimal tenderness to palpation which is an improvement since admission.  Disposition:  Discharge disposition: 01-Home or Self Care        Allergies as of 03/24/2019      Reactions   Cephalexin Rash   Fish Allergy Hives   Ivp Dye [iodinated Diagnostic Agents]  Other (See Comments), Anaphylaxis   Blood pressure drops very low eyes roll back in the back of her head cant breathe Shock, low BP   Nitrofurantoin Rash   Sulfa Antibiotics Hives, Rash   Aspirin Other (See Comments)   Stomach issues   Ciprofloxacin Hives   Doxycycline    Other Other (See Comments)   Myobetria/ dizzness   Oxycodone Other (See Comments)   Other Reaction: Not Assessed   Percocet [oxycodone-acetaminophen] Other (See Comments)   Mental reaction   Prednisone Nausea Only   Tequin [gatifloxacin] Hives   Latex Rash   Myrbetriq [mirabegron] Rash   Premarin [conjugated Estrogens] Rash      Medication List    TAKE these medications   Advair Diskus 250-50 MCG/DOSE Aepb Generic drug: Fluticasone-Salmeterol Inhale 1 puff into the lungs in the morning and at bedtime.   albuterol (2.5 MG/3ML) 0.083% nebulizer solution Commonly known as: PROVENTIL Take 3 mLs (2.5 mg total) by nebulization every 6 (six) hours as needed for wheezing or shortness of breath.   ProAir HFA 108 (90 Base) MCG/ACT inhaler Generic drug: albuterol INHALE 2 PUFFS BY MOUTH EVERY 4 HOURS ASNEEDED FOR WHEEZING OR SHORTNESS OF BREATH   ALPRAZolam 1 MG tablet Commonly known as: XANAX Take 1 mg by mouth in the morning and at bedtime.   ipratropium-albuterol 0.5-2.5 (3) MG/3ML Soln Commonly known as: DUONEB Take 3 mLs by nebulization every 4 (four) hours as needed.   metoprolol succinate 50 MG 24 hr tablet Commonly known as: TOPROL-XL TAKE ONE TABLET BY MOUTH DAILY What changed: how much to take   tiotropium  18 MCG inhalation capsule Commonly known as: SPIRIVA Place 18 mcg into inhaler and inhale daily.         Total time spent arranging discharge was >80min. Signed: Benjamine Sprague 03/26/2019, 12:38 PM

## 2019-03-28 ENCOUNTER — Other Ambulatory Visit: Payer: Self-pay

## 2019-03-28 ENCOUNTER — Other Ambulatory Visit
Admission: RE | Admit: 2019-03-28 | Discharge: 2019-03-28 | Disposition: A | Discharge status: Home / Self Care | Payer: Medicaid Other | Source: Ambulatory Visit | Attending: Surgery | Admitting: Surgery

## 2019-03-28 DIAGNOSIS — Z01812 Encounter for preprocedural laboratory examination: Secondary | ICD-10-CM | POA: Diagnosis not present

## 2019-03-28 DIAGNOSIS — Z20822 Contact with and (suspected) exposure to covid-19: Secondary | ICD-10-CM | POA: Diagnosis not present

## 2019-03-28 LAB — SARS CORONAVIRUS 2 (TAT 6-24 HRS): SARS Coronavirus 2: NEGATIVE

## 2019-03-31 ENCOUNTER — Ambulatory Visit: Payer: Self-pay | Admitting: Surgery

## 2019-04-01 ENCOUNTER — Ambulatory Visit: Payer: Medicaid Other | Admitting: Anesthesiology

## 2019-04-01 ENCOUNTER — Encounter
Admission: RE | Disposition: A | Discharge status: Home / Self Care | Payer: Self-pay | Source: Ambulatory Visit | Attending: Surgery

## 2019-04-01 ENCOUNTER — Encounter: Payer: Self-pay | Admitting: Surgery

## 2019-04-01 ENCOUNTER — Ambulatory Visit
Admission: RE | Admit: 2019-04-01 | Discharge: 2019-04-01 | Disposition: A | Discharge status: Home / Self Care | Payer: Medicaid Other | Source: Ambulatory Visit | Attending: Surgery | Admitting: Surgery

## 2019-04-01 ENCOUNTER — Other Ambulatory Visit: Payer: Self-pay

## 2019-04-01 DIAGNOSIS — K430 Incisional hernia with obstruction, without gangrene: Secondary | ICD-10-CM | POA: Insufficient documentation

## 2019-04-01 DIAGNOSIS — Z881 Allergy status to other antibiotic agents status: Secondary | ICD-10-CM | POA: Diagnosis not present

## 2019-04-01 DIAGNOSIS — K439 Ventral hernia without obstruction or gangrene: Secondary | ICD-10-CM

## 2019-04-01 DIAGNOSIS — Z91041 Radiographic dye allergy status: Secondary | ICD-10-CM | POA: Diagnosis not present

## 2019-04-01 DIAGNOSIS — J449 Chronic obstructive pulmonary disease, unspecified: Secondary | ICD-10-CM | POA: Diagnosis not present

## 2019-04-01 DIAGNOSIS — Z87891 Personal history of nicotine dependence: Secondary | ICD-10-CM | POA: Diagnosis not present

## 2019-04-01 DIAGNOSIS — I129 Hypertensive chronic kidney disease with stage 1 through stage 4 chronic kidney disease, or unspecified chronic kidney disease: Secondary | ICD-10-CM | POA: Diagnosis not present

## 2019-04-01 DIAGNOSIS — Z888 Allergy status to other drugs, medicaments and biological substances status: Secondary | ICD-10-CM | POA: Diagnosis not present

## 2019-04-01 DIAGNOSIS — N183 Chronic kidney disease, stage 3 unspecified: Secondary | ICD-10-CM | POA: Insufficient documentation

## 2019-04-01 DIAGNOSIS — Z886 Allergy status to analgesic agent status: Secondary | ICD-10-CM | POA: Diagnosis not present

## 2019-04-01 DIAGNOSIS — Z882 Allergy status to sulfonamides status: Secondary | ICD-10-CM | POA: Diagnosis not present

## 2019-04-01 DIAGNOSIS — Z885 Allergy status to narcotic agent status: Secondary | ICD-10-CM | POA: Diagnosis not present

## 2019-04-01 HISTORY — PX: XI ROBOTIC ASSISTED VENTRAL HERNIA: SHX6789

## 2019-04-01 SURGERY — REPAIR, HERNIA, VENTRAL, ROBOT-ASSISTED
Anesthesia: General | Site: Abdomen

## 2019-04-01 MED ORDER — ROCURONIUM BROMIDE 100 MG/10ML IV SOLN
INTRAVENOUS | Status: DC | PRN
  Administered 2019-04-01: 30 mg via INTRAVENOUS
  Administered 2019-04-01: 10 mg via INTRAVENOUS
  Administered 2019-04-01: 20 mg via INTRAVENOUS
  Administered 2019-04-01 (×2): 5 mg via INTRAVENOUS

## 2019-04-01 MED ORDER — EPHEDRINE SULFATE 50 MG/ML IJ SOLN
INTRAMUSCULAR | Status: DC | PRN
  Administered 2019-04-01: 5 mg via INTRAVENOUS

## 2019-04-01 MED ORDER — ONDANSETRON HCL 4 MG/2ML IJ SOLN: 4.0000 mg | Freq: Once | INTRAMUSCULAR | Status: DC | PRN

## 2019-04-01 MED ORDER — SUGAMMADEX SODIUM 200 MG/2ML IV SOLN
INTRAVENOUS | Status: DC | PRN
  Administered 2019-04-01: 200 mg via INTRAVENOUS

## 2019-04-01 MED ORDER — SODIUM CHLORIDE FLUSH 0.9 % IV SOLN
INTRAVENOUS | Status: AC
  Filled 2019-04-01: qty 10

## 2019-04-01 MED ORDER — ROCURONIUM BROMIDE 10 MG/ML (PF) SYRINGE
PREFILLED_SYRINGE | INTRAVENOUS | Status: AC
  Filled 2019-04-01: qty 10

## 2019-04-01 MED ORDER — EPINEPHRINE PF 1 MG/ML IJ SOLN
INTRAMUSCULAR | Status: AC
  Filled 2019-04-01: qty 1

## 2019-04-01 MED ORDER — GABAPENTIN 300 MG PO CAPS
ORAL_CAPSULE | ORAL | Status: AC
  Filled 2019-04-01: qty 1

## 2019-04-01 MED ORDER — SUCCINYLCHOLINE CHLORIDE 200 MG/10ML IV SOSY
PREFILLED_SYRINGE | INTRAVENOUS | Status: AC
  Filled 2019-04-01: qty 10

## 2019-04-01 MED ORDER — PROPOFOL 10 MG/ML IV BOLUS
INTRAVENOUS | Status: DC | PRN
  Administered 2019-04-01: 100 mg via INTRAVENOUS

## 2019-04-01 MED ORDER — FENTANYL CITRATE (PF) 100 MCG/2ML IJ SOLN
INTRAMUSCULAR | Status: DC | PRN
  Administered 2019-04-01 (×2): 50 ug via INTRAVENOUS

## 2019-04-01 MED ORDER — DOCUSATE SODIUM 100 MG PO CAPS: 100.0000 mg | ORAL_CAPSULE | Freq: Two times a day (BID) | ORAL | 0 refills | Status: AC | PRN

## 2019-04-01 MED ORDER — SODIUM CHLORIDE 0.9 % IV SOLN: INTRAVENOUS | Status: DC

## 2019-04-01 MED ORDER — SEVOFLURANE IN SOLN
RESPIRATORY_TRACT | Status: AC
  Filled 2019-04-01: qty 250

## 2019-04-01 MED ORDER — CHLORHEXIDINE GLUCONATE CLOTH 2 % EX PADS
6.0000 | MEDICATED_PAD | Freq: Once | CUTANEOUS | Status: AC
  Administered 2019-04-01: 09:00:00 6 via TOPICAL

## 2019-04-01 MED ORDER — PHENYLEPHRINE HCL (PRESSORS) 10 MG/ML IV SOLN
INTRAVENOUS | Status: DC | PRN
  Administered 2019-04-01: 150 ug via INTRAVENOUS
  Administered 2019-04-01: 100 ug via INTRAVENOUS
  Administered 2019-04-01 (×3): 50 ug via INTRAVENOUS
  Administered 2019-04-01: 100 ug via INTRAVENOUS
  Administered 2019-04-01 (×2): 50 ug via INTRAVENOUS
  Administered 2019-04-01: 25 ug via INTRAVENOUS

## 2019-04-01 MED ORDER — ONDANSETRON HCL 4 MG/2ML IJ SOLN
INTRAMUSCULAR | Status: DC | PRN
  Administered 2019-04-01: 4 mg via INTRAVENOUS

## 2019-04-01 MED ORDER — ACETAMINOPHEN 500 MG PO TABS
1000.0000 mg | ORAL_TABLET | ORAL | Status: AC
  Administered 2019-04-01: 09:00:00 1000 mg via ORAL

## 2019-04-01 MED ORDER — BUPIVACAINE-EPINEPHRINE 0.5% -1:200000 IJ SOLN
INTRAMUSCULAR | Status: DC | PRN
  Administered 2019-04-01: 20 mL
  Administered 2019-04-01: 10 mL

## 2019-04-01 MED ORDER — ONDANSETRON HCL 4 MG/2ML IJ SOLN
INTRAMUSCULAR | Status: AC
  Filled 2019-04-01: qty 2

## 2019-04-01 MED ORDER — DEXMEDETOMIDINE HCL 200 MCG/2ML IV SOLN
INTRAVENOUS | Status: DC | PRN
  Administered 2019-04-01: 4 ug via INTRAVENOUS
  Administered 2019-04-01: 8 ug via INTRAVENOUS
  Administered 2019-04-01 (×2): 4 ug via INTRAVENOUS
  Administered 2019-04-01: 8 ug via INTRAVENOUS

## 2019-04-01 MED ORDER — BUPIVACAINE LIPOSOME 1.3 % IJ SUSP
INTRAMUSCULAR | Status: AC
  Filled 2019-04-01: qty 20

## 2019-04-01 MED ORDER — ACETAMINOPHEN 500 MG PO TABS
ORAL_TABLET | ORAL | Status: AC
  Filled 2019-04-01: qty 2

## 2019-04-01 MED ORDER — LACTATED RINGERS IV SOLN: INTRAVENOUS | Status: DC | PRN

## 2019-04-01 MED ORDER — METOPROLOL TARTRATE 5 MG/5ML IV SOLN
INTRAVENOUS | Status: AC
  Filled 2019-04-01: qty 5

## 2019-04-01 MED ORDER — KETAMINE HCL 10 MG/ML IJ SOLN
INTRAMUSCULAR | Status: DC | PRN
  Administered 2019-04-01: 10 mg via INTRAVENOUS
  Administered 2019-04-01: 5 mg via INTRAVENOUS
  Administered 2019-04-01: 20 mg via INTRAVENOUS

## 2019-04-01 MED ORDER — DEXAMETHASONE SODIUM PHOSPHATE 10 MG/ML IJ SOLN
INTRAMUSCULAR | Status: DC | PRN
  Administered 2019-04-01: 8 mg via INTRAVENOUS

## 2019-04-01 MED ORDER — PROPOFOL 10 MG/ML IV BOLUS
INTRAVENOUS | Status: AC
  Filled 2019-04-01: qty 20

## 2019-04-01 MED ORDER — IPRATROPIUM-ALBUTEROL 0.5-2.5 (3) MG/3ML IN SOLN
RESPIRATORY_TRACT | Status: AC
  Administered 2019-04-01: 16:00:00 3 mL via RESPIRATORY_TRACT
  Filled 2019-04-01: qty 3

## 2019-04-01 MED ORDER — MIDAZOLAM HCL 2 MG/2ML IJ SOLN
INTRAMUSCULAR | Status: DC | PRN
  Administered 2019-04-01: 1 mg via INTRAVENOUS

## 2019-04-01 MED ORDER — IPRATROPIUM-ALBUTEROL 0.5-2.5 (3) MG/3ML IN SOLN: 3.0000 mL | Freq: Once | RESPIRATORY_TRACT | Status: AC

## 2019-04-01 MED ORDER — CLINDAMYCIN PHOSPHATE 900 MG/50ML IV SOLN
INTRAVENOUS | Status: AC
  Filled 2019-04-01: qty 50

## 2019-04-01 MED ORDER — FENTANYL CITRATE (PF) 100 MCG/2ML IJ SOLN
25.0000 ug | INTRAMUSCULAR | Status: DC | PRN
  Administered 2019-04-01 (×2): 25 ug via INTRAVENOUS

## 2019-04-01 MED ORDER — MIDAZOLAM HCL 2 MG/2ML IJ SOLN
INTRAMUSCULAR | Status: AC
  Filled 2019-04-01: qty 2

## 2019-04-01 MED ORDER — FENTANYL CITRATE (PF) 100 MCG/2ML IJ SOLN
INTRAMUSCULAR | Status: AC
  Administered 2019-04-01: 25 ug via INTRAVENOUS
  Filled 2019-04-01: qty 2

## 2019-04-01 MED ORDER — SODIUM CHLORIDE 0.9 % IV SOLN
INTRAVENOUS | Status: DC | PRN
  Administered 2019-04-01: 70 mL

## 2019-04-01 MED ORDER — SUCCINYLCHOLINE CHLORIDE 20 MG/ML IJ SOLN
INTRAMUSCULAR | Status: DC | PRN
  Administered 2019-04-01: 120 mg via INTRAVENOUS

## 2019-04-01 MED ORDER — METOPROLOL TARTRATE 5 MG/5ML IV SOLN
INTRAVENOUS | Status: DC | PRN
  Administered 2019-04-01 (×2): 1 mg via INTRAVENOUS
  Administered 2019-04-01: 2 mg via INTRAVENOUS

## 2019-04-01 MED ORDER — BUPIVACAINE HCL (PF) 0.5 % IJ SOLN
INTRAMUSCULAR | Status: AC
  Filled 2019-04-01: qty 30

## 2019-04-01 MED ORDER — IBUPROFEN 800 MG PO TABS: 800.0000 mg | ORAL_TABLET | Freq: Three times a day (TID) | ORAL | 0 refills | Status: DC | PRN

## 2019-04-01 MED ORDER — PHENYLEPHRINE HCL-NACL 10-0.9 MG/250ML-% IV SOLN
INTRAVENOUS | Status: DC | PRN
  Administered 2019-04-01: 25 ug/min via INTRAVENOUS

## 2019-04-01 MED ORDER — TRAMADOL HCL 50 MG PO TABS: 50.0000 mg | ORAL_TABLET | Freq: Four times a day (QID) | ORAL | 0 refills | Status: AC | PRN

## 2019-04-01 MED ORDER — DEXAMETHASONE SODIUM PHOSPHATE 10 MG/ML IJ SOLN
INTRAMUSCULAR | Status: AC
  Filled 2019-04-01: qty 1

## 2019-04-01 MED ORDER — METOPROLOL SUCCINATE ER 50 MG PO TB24: 100.0000 mg | ORAL_TABLET | Freq: Every day | ORAL | Status: DC

## 2019-04-01 MED ORDER — ACETAMINOPHEN 325 MG PO TABS: 650.0000 mg | ORAL_TABLET | Freq: Three times a day (TID) | ORAL | 0 refills | Status: AC | PRN

## 2019-04-01 MED ORDER — CLINDAMYCIN PHOSPHATE 900 MG/50ML IV SOLN
900.0000 mg | INTRAVENOUS | Status: AC
  Administered 2019-04-01: 900 mg via INTRAVENOUS

## 2019-04-01 MED ORDER — SODIUM CHLORIDE (PF) 0.9 % IJ SOLN
INTRAMUSCULAR | Status: AC
  Filled 2019-04-01: qty 50

## 2019-04-01 MED ORDER — ALBUTEROL SULFATE HFA 108 (90 BASE) MCG/ACT IN AERS
INHALATION_SPRAY | RESPIRATORY_TRACT | Status: DC | PRN
  Administered 2019-04-01 (×2): 8 via RESPIRATORY_TRACT

## 2019-04-01 MED ORDER — KETAMINE HCL 50 MG/ML IJ SOLN
INTRAMUSCULAR | Status: AC
  Filled 2019-04-01: qty 10

## 2019-04-01 MED ORDER — ACETAMINOPHEN 10 MG/ML IV SOLN
INTRAVENOUS | Status: DC | PRN
  Administered 2019-04-01: .1 mg via INTRAVENOUS

## 2019-04-01 MED ORDER — FENTANYL CITRATE (PF) 100 MCG/2ML IJ SOLN
INTRAMUSCULAR | Status: AC
  Filled 2019-04-01: qty 2

## 2019-04-01 MED ORDER — LIDOCAINE HCL (PF) 2 % IJ SOLN
INTRAMUSCULAR | Status: AC
  Filled 2019-04-01: qty 5

## 2019-04-01 MED ORDER — GABAPENTIN 300 MG PO CAPS
300.0000 mg | ORAL_CAPSULE | ORAL | Status: AC
  Administered 2019-04-01: 09:00:00 300 mg via ORAL

## 2019-04-01 MED ORDER — ALBUTEROL SULFATE HFA 108 (90 BASE) MCG/ACT IN AERS
INHALATION_SPRAY | RESPIRATORY_TRACT | Status: AC
  Filled 2019-04-01: qty 6.7

## 2019-04-01 SURGICAL SUPPLY — 66 items
ADH SKN CLS APL DERMABOND .7 (GAUZE/BANDAGES/DRESSINGS) ×1
APL PRP STRL LF DISP 70% ISPRP (MISCELLANEOUS)
BLADE SURG SZ11 CARB STEEL (BLADE) ×3 IMPLANT
CANISTER SUCT 1200ML W/VALVE (MISCELLANEOUS) ×3 IMPLANT
CANNULA REDUC XI 12-8 STAPL (CANNULA) ×2
CANNULA REDUC XI 12-8MM STAPL (CANNULA) ×1
CANNULA REDUCER 12-8 DVNC XI (CANNULA) ×1 IMPLANT
CHLORAPREP W/TINT 26 (MISCELLANEOUS) ×1 IMPLANT
COVER TIP SHEARS 8 DVNC (MISCELLANEOUS) ×1 IMPLANT
COVER TIP SHEARS 8MM DA VINCI (MISCELLANEOUS) ×3
COVER WAND RF STERILE (DRAPES) ×3 IMPLANT
DEFOGGER SCOPE WARMER CLEARIFY (MISCELLANEOUS) ×3 IMPLANT
DERMABOND ADVANCED (GAUZE/BANDAGES/DRESSINGS) ×2
DERMABOND ADVANCED .7 DNX12 (GAUZE/BANDAGES/DRESSINGS) ×1 IMPLANT
DRAPE 3/4 80X56 (DRAPES) ×1 IMPLANT
DRAPE ARM DVNC X/XI (DISPOSABLE) ×4 IMPLANT
DRAPE COLUMN DVNC XI (DISPOSABLE) ×1 IMPLANT
DRAPE DA VINCI XI ARM (DISPOSABLE) ×12
DRAPE DA VINCI XI COLUMN (DISPOSABLE) ×3
ELECT CAUTERY BLADE 6.4 (BLADE) ×3 IMPLANT
ELECT REM PT RETURN 9FT ADLT (ELECTROSURGICAL) ×3
ELECTRODE REM PT RTRN 9FT ADLT (ELECTROSURGICAL) ×1 IMPLANT
ETHIBOND 2 0 GREEN CT 2 30IN (SUTURE) ×6 IMPLANT
GLOVE BIOGEL PI IND STRL 7.0 (GLOVE) ×1 IMPLANT
GLOVE BIOGEL PI INDICATOR 7.0 (GLOVE) ×2
GLOVE SURG SYN 6.5 ES PF (GLOVE) ×3 IMPLANT
GLOVE SURG SYN 6.5 PF PI (GLOVE) ×1 IMPLANT
GOWN STRL REUS W/ TWL LRG LVL3 (GOWN DISPOSABLE) ×3 IMPLANT
GOWN STRL REUS W/TWL LRG LVL3 (GOWN DISPOSABLE) ×9
GRASPER SUT TROCAR 14GX15 (MISCELLANEOUS) ×3 IMPLANT
IRRIGATOR SUCT 8 DISP DVNC XI (IRRIGATION / IRRIGATOR) IMPLANT
IRRIGATOR SUCTION 8MM XI DISP (IRRIGATION / IRRIGATOR)
IV NS 1000ML (IV SOLUTION)
IV NS 1000ML BAXH (IV SOLUTION) IMPLANT
KIT PINK PAD W/HEAD ARE REST (MISCELLANEOUS) ×3
KIT PINK PAD W/HEAD ARM REST (MISCELLANEOUS) ×1 IMPLANT
L-HOOK LAP DISP 36CM (ELECTROSURGICAL) ×3
LABEL OR SOLS (LABEL) ×3 IMPLANT
LHOOK LAP DISP 36CM (ELECTROSURGICAL) IMPLANT
MESH VENT LT ST 11.4CM CRL (Mesh General) ×2 IMPLANT
NDL INSUFFLATION 14GA 120MM (NEEDLE) ×1 IMPLANT
NEEDLE HYPO 22GX1.5 SAFETY (NEEDLE) ×3 IMPLANT
NEEDLE INSUFFLATION 14GA 120MM (NEEDLE) ×3 IMPLANT
OBTURATOR OPTICAL STANDARD 8MM (TROCAR) ×3
OBTURATOR OPTICAL STND 8 DVNC (TROCAR) ×1
OBTURATOR OPTICALSTD 8 DVNC (TROCAR) ×1 IMPLANT
PACK LAP CHOLECYSTECTOMY (MISCELLANEOUS) ×3 IMPLANT
PENCIL ELECTRO HAND CTR (MISCELLANEOUS) ×3 IMPLANT
SEAL CANN UNIV 5-8 DVNC XI (MISCELLANEOUS) ×2 IMPLANT
SEAL XI 5MM-8MM UNIVERSAL (MISCELLANEOUS) ×6
SOLUTION ELECTROLUBE (MISCELLANEOUS) ×3 IMPLANT
SPONGE LAP 4X18 RFD (DISPOSABLE) ×2 IMPLANT
STAPLER CANNULA SEAL DVNC XI (STAPLE) ×1 IMPLANT
STAPLER CANNULA SEAL XI (STAPLE) ×3
SUT DVC VLOC 3-0 CL 6 P-12 (SUTURE) ×2 IMPLANT
SUT MNCRL AB 4-0 PS2 18 (SUTURE) ×3 IMPLANT
SUT STRATAFIX 0 PDS+ CT-2 23 (SUTURE) ×3
SUT V-LOC 90 ABS DVC 3-0 CL (SUTURE) ×2 IMPLANT
SUT VIC AB 3-0 SH 27 (SUTURE) ×3
SUT VIC AB 3-0 SH 27X BRD (SUTURE) ×1 IMPLANT
SUT VICRYL 0 AB UR-6 (SUTURE) ×3 IMPLANT
SUTURE STRATFX 0 PDS+ CT-2 23 (SUTURE) IMPLANT
SYR 30ML LL (SYRINGE) ×3 IMPLANT
TRAY FOLEY MTR SLVR 16FR STAT (SET/KITS/TRAYS/PACK) ×3 IMPLANT
TROCAR XCEL NON-BLD 5MMX100MML (ENDOMECHANICALS) ×3 IMPLANT
TUBING EVAC SMOKE HEATED PNEUM (TUBING) ×3 IMPLANT

## 2019-04-01 NOTE — Anesthesia Preprocedure Evaluation (Signed)
Anesthesia Evaluation  Patient identified by MRN, date of birth, ID band Patient awake    Reviewed: Allergy & Precautions, NPO status , Patient's Chart, lab work & pertinent test results  History of Anesthesia Complications Negative for: history of anesthetic complications  Airway Mallampati: III  TM Distance: >3 FB Neck ROM: Full    Dental  (+) Poor Dentition   Pulmonary asthma , sleep apnea, Continuous Positive Airway Pressure Ventilation and Oxygen sleep apnea , COPD,  oxygen dependent, former smoker,    breath sounds clear to auscultation- rhonchi (-) wheezing      Cardiovascular hypertension, (-) CAD, (-) Past MI, (-) Cardiac Stents and (-) CABG  Rhythm:Regular Rate:Normal - Systolic murmurs and - Diastolic murmurs    Neuro/Psych neg Seizures PSYCHIATRIC DISORDERS Anxiety Depression negative neurological ROS     GI/Hepatic Neg liver ROS, hiatal hernia, GERD  ,  Endo/Other  negative endocrine ROSneg diabetes  Renal/GU CRFRenal disease     Musculoskeletal negative musculoskeletal ROS (+)   Abdominal (+) + obese,   Peds  Hematology  (+) anemia ,   Anesthesia Other Findings Past Medical History: No date: Anemia No date: Anxiety     Comment:  followed by Dr. Janeece Riggers No date: Asthma March 2016: AVM (arteriovenous malformation)     Comment:  distal left thigh No date: Chronic kidney disease, stage II (mild) No date: CKD (chronic kidney disease) stage 3, GFR 30-59 ml/min No date: COPD (chronic obstructive pulmonary disease) (HCC) No date: Depression     Comment:  followed by Dr. Janeece Riggers No date: Diverticulitis     Comment:  hospitalized several times No date: GERD (gastroesophageal reflux disease) No date: Heart murmur No date: Hiatal hernia No date: Hypercholesteremia No date: Hyperlipidemia No date: Hypertension No date: Leaky heart valve 06/03/2015: Low serum cortisol level (HCC) No date: Murmur 03/27/2016:  Osteopenia determined by x-ray No date: Overactive bladder No date: Panic attacks     Comment:  followed by Dr. Janeece Riggers   Reproductive/Obstetrics                             Anesthesia Physical Anesthesia Plan  ASA: IV  Anesthesia Plan: General   Post-op Pain Management:    Induction: Intravenous  PONV Risk Score and Plan: 2 and Ondansetron, Dexamethasone and Treatment may vary due to age or medical condition  Airway Management Planned: Oral ETT  Additional Equipment:   Intra-op Plan:   Post-operative Plan: Extubation in OR and Possible Post-op intubation/ventilation  Informed Consent: I have reviewed the patients History and Physical, chart, labs and discussed the procedure including the risks, benefits and alternatives for the proposed anesthesia with the patient or authorized representative who has indicated his/her understanding and acceptance.     Dental advisory given  Plan Discussed with: CRNA and Anesthesiologist  Anesthesia Plan Comments:         Anesthesia Quick Evaluation

## 2019-04-01 NOTE — Transfer of Care (Signed)
Immediate Anesthesia Transfer of Care Note  Patient: Kylie [name]  Procedure(s) Performed: XI ROBOTIC ASSISTED VENTRAL HERNIA (N/A Abdomen)  Patient Location: PACU  Anesthesia Type:General  Level of Consciousness: drowsy  Airway & Oxygen Therapy: Patient Spontanous Breathing and Patient connected to face mask oxygen  Post-op Assessment: Report given to RN and Post -op Vital signs reviewed and stable  Post vital signs: Reviewed  Last Vitals:  Vitals Value Taken Time  BP 118/55 04/01/19 1501  Temp    Pulse 94 04/01/19 1503  Resp 16 04/01/19 1503  SpO2 97 % 04/01/19 1503  Vitals shown include unvalidated device data.  Last Pain:  Vitals:   04/01/19 0836  TempSrc: Oral  PainSc: 0-No pain         Complications: No apparent anesthesia complications

## 2019-04-01 NOTE — Interval H&P Note (Signed)
History and Physical Interval Note:  04/01/2019 8:58 AM  Kylie [name]  has presented today for surgery, with the diagnosis of K43.9 Vebtral hernia.  The various methods of treatment have been discussed with the patient and family. After consideration of risks, benefits and other options for treatment, the patient has consented to  Procedure(s): XI ROBOTIC ASSISTED VENTRAL HERNIA (N/A) as a surgical intervention.  The patient's history has been reviewed, patient examined, no change in status, but due to persistent hernia and fear of another acute exacerbation in pain, we have decided to proceed to repair, understanding increased risk due to her comorbidities. As stable for surgery as she can be.    Discussed again the risk of surgery including recurrence, which can be up to 50% in the case of incisional or complex hernias, possible use of prosthetic materials (mesh) and the increased risk of mesh infxn if used, bleeding, chronic pain, post-op infxn, post-op SBO or ileus, and possible re-operation to address said risks. The risks of general anesthetic, if used, includes MI, CVA, sudden death or even reaction to anesthetic medications also discussed. Alternatives include continued observation.  Benefits include possible symptom relief, prevention of incarceration, strangulation, enlargement in size over time, and the risk of emergency surgery in the face of strangulation.  Typical post-op recovery time of 3-5 days with 4-6 weeks of activity restrictions were also discussed.    I have reviewed the patient's chart and labs.  Questions were answered to the patient's satisfaction.     Kylie [name]

## 2019-04-01 NOTE — Discharge Instructions (Signed)
Hernia repair, Care After This sheet gives you information about how to care for yourself after your procedure. Your health care provider may also give you more specific instructions. If you have problems or questions, contact your health care provider. What can I expect after the procedure? After your procedure, it is common to have the following:  Pain in your abdomen, especially in the incision areas. You will be given medicine to control the pain.  Tiredness. This is a normal part of the recovery process. Your energy level will return to normal over the next several weeks.  Changes in your bowel movements, such as constipation or needing to go more often. Talk with your health care provider about how to manage this. Follow these instructions at home: Medicines   tylenol and advil as needed for discomfort.  Please alternate between the two every four hours as needed for pain.     Use narcotics, if prescribed, only when tylenol and motrin is not enough to control pain.   325-650mg every 8hrs to max of 3000mg/24hrs (including the 325mg in every norco dose) for the tylenol.     Advil up to 800mg per dose every 8hrs as needed for pain.    PLEASE RECORD NUMBER OF PILLS TAKEN UNTIL NEXT FOLLOW UP APPT.  THIS WILL HELP DETERMINE HOW READY YOU ARE TO BE RELEASED FROM ANY ACTIVITY RESTRICTIONS  Do not drive or use heavy machinery while taking prescription pain medicine.  Do not drink alcohol while taking prescription pain medicine.  Incision care     Follow instructions from your health care provider about how to take care of your incision areas. Make sure you: ? Keep your incisions clean and dry. ? Wash your hands with soap and water before and after applying medicine to the areas, and before and after changing your bandage (dressing). If soap and water are not available, use hand sanitizer. ? Change your dressing as told by your health care provider. ? Leave stitches (sutures), skin  glue, or adhesive strips in place. These skin closures may need to stay in place for 2 weeks or longer. If adhesive strip edges start to loosen and curl up, you may trim the loose edges. Do not remove adhesive strips completely unless your health care provider tells you to do that.  Do not wear tight clothing over the incisions. Tight clothing may rub and irritate the incision areas, which may cause the incisions to open.  Do not take baths, swim, or use a hot tub until your health care provider approves. OK TO SHOWER IN 24HRS.    Check your incision area every day for signs of infection. Check for: ? More redness, swelling, or pain. ? More fluid or blood. ? Warmth. ? Pus or a bad smell. Activity  Avoid lifting anything that is heavier than 10 lb (4.5 kg) for 2 weeks or until your health care provider says it is okay.  No pushing/pulling greater than 30lbs  You may resume normal activities as told by your health care provider. Ask your health care provider what activities are safe for you.  Take rest breaks during the day as needed. Eating and drinking  Follow instructions from your health care provider about what you can eat after surgery.  To prevent or treat constipation while you are taking prescription pain medicine, your health care provider may recommend that you: ? Drink enough fluid to keep your urine clear or pale yellow. ? Take over-the-counter or prescription medicines. ?   Eat foods that are high in fiber, such as fresh fruits and vegetables, whole grains, and beans. ? Limit foods that are high in fat and processed sugars, such as fried and sweet foods. General instructions  Ask your health care provider when you will need an appointment to get your sutures or staples removed.  Keep all follow-up visits as told by your health care provider. This is important. Contact a health care provider if:  You have more redness, swelling, or pain around your incisions.  You have  more fluid or blood coming from the incisions.  Your incisions feel warm to the touch.  You have pus or a bad smell coming from your incisions or your dressing.  You have a fever.  You have an incision that breaks open (edges not staying together) after sutures or staples have been removed. Get help right away if:  You develop a rash.  You have chest pain or difficulty breathing.  You have pain or swelling in your legs.  You feel light-headed or you faint.  Your abdomen swells (becomes distended).  You have nausea or vomiting.  You have blood in your stool (feces). This information is not intended to replace advice given to you by your health care provider. Make sure you discuss any questions you have with your health care provider. Document Released: 07/15/2004 Document Revised: 09/14/2017 Document Reviewed: 09/27/2015 Elsevier Interactive Patient Education  2019 [employer]    AMBULATORY SURGERY  DISCHARGE INSTRUCTIONS   1) The drugs that you were given will stay in your system until tomorrow so for the next 24 hours you should not:  A) Drive an automobile B) Make any legal decisions C) Drink any alcoholic beverage   2) You may resume regular meals tomorrow.  Today it is better to start with liquids and gradually work up to solid foods.  You may eat anything you prefer, but it is better to start with liquids, then soup and crackers, and gradually work up to solid foods.   3) Please notify your doctor immediately if you have any unusual bleeding, trouble breathing, redness and pain at the surgery site, drainage, fever, or pain not relieved by medication.    4) Additional Instructions:        Please contact your physician with any problems or Same Day Surgery at [phone], Monday through Friday 6 am to 4 pm, or [facility] number at [phone].  

## 2019-04-01 NOTE — Op Note (Signed)
Preoperative diagnosis: Incisional hernia incarcerated Postoperative diagnosis: same  Procedure: Robotic assisted laparoscopic incarcerated incisional hernia repair with mesh, lysis of adhesions  Anesthesia: general  Surgeon: Sung Amabile  Wound Classification: Clean  Specimen: none  Complications: None  Estimated Blood Loss: 60ml  Indications:see HPI  Findings: 1. Perforated incisional with bowel hernia 4. Tension free repair achieved with 11.5 cm round bard echo +2 mesh and suture 5. Adequate hemostasis  Description of procedure: The patient was brought to the operating room and general anesthesia was induced. A time-out was completed verifying correct patient, procedure, site, positioning, and implant(s) and/or special equipment prior to beginning this procedure. Antibiotics were administered prior to making the incision. SCDs placed.  Foley placed.  The anterior abdominal wall was prepped and draped in the standard sterile fashion.   Palmer's point chosen for entry.  Veress needle placed and abdomen insufflated to 15cm without any dramatic increase in pressure.  Needle removed and optiview technique used to place 46mm port at same point.  No injury noted during placement.  3 additional ports, 74mm x2 and 38mm, along right lateral aspect placed.  In between the extensive adhesions that were noted in the abdominal cavity.   Xi robot then docked into place.    Majority of the operation was then dedicated to the extensive lysis of adhesions to isolate the hernia contents.  The hernia contents itself were densely adhered to the hernia sac, and additional time was taken to separate the hernia contents from the hernia defect and sac itself.  Inspection of the bowel that was in the hernia sac contents after separation noted a possible small serosal tear that was repaired using 3-0 silk in a figure-of-eight fashion, making sure that the lumen was not significantly constricted during the repair.   Hemostasis achieved throughout this portion.  Once all hernia contents reduced, there was noted to be a 3 hernias, adjacent to each other with 2 measuring approximately 1 cm and the largest one measuring 3 cm in diameter.  Insufflation dropped to 49mm and transfacial suture with 0 stratafix used to primarily close defects under minimal tension. Bard echo plus protected 11.5 cm round mesh was placed within the abdominal cavity through 9mm port and secured to the abdominal wall centered over the defect using the echo plus system.  The mesh was then circumferentially sutured into the anterior abdominal wall using 3-0 VLock x2.  Any bleeding noted during this portion was no longer actively bleeding by end of securing mesh and tightening the suture.    Robot was undocked.  The 34mm cannula was removed and port site was closed using PMI device and 0 vicryl suture, ensuring no bowels were injured during this process.  Exparel was infused as a peripheral block around the mesh and port sites.  Abdomen then desufflated while camera within abdomen to ensure no signs of new bleed prior to removing camera and rest of ports completely.  All skin incisions closed with running 4-0 Monocryl in a subcuticular fashion.  All wounds then dressed with Dermabond.  Foley removed and patient was then successfully awakened and transferred to PACU in stable condition.  At the end of the procedure sponge and instrument counts were correct.

## 2019-04-01 NOTE — Anesthesia Procedure Notes (Signed)
Procedure Name: Intubation Date/Time: 04/01/2019 10:10 AM Performed by: Junious Silk, CRNA Pre-anesthesia Checklist: Patient identified, Patient being monitored, Timeout performed, Emergency Drugs available and Suction available Patient Re-evaluated:Patient Re-evaluated prior to induction Oxygen Delivery Method: Circle system utilized Preoxygenation: Pre-oxygenation with 100% oxygen Induction Type: IV induction Ventilation: Mask ventilation without difficulty Laryngoscope Size: 3 and McGraph Grade View: Grade I Tube type: Oral Tube size: 7.0 mm Number of attempts: 1 Airway Equipment and Method: Stylet Placement Confirmation: ETT inserted through vocal cords under direct vision,  positive ETCO2 and breath sounds checked- equal and bilateral Secured at: 21 cm Tube secured with: Tape Dental Injury: Teeth and Oropharynx as per pre-operative assessment

## 2019-04-02 ENCOUNTER — Observation Stay: Admit: 2019-04-02 | Payer: Medicaid Other | Admitting: Surgery

## 2019-04-02 ENCOUNTER — Inpatient Hospital Stay
Admission: EM | Admit: 2019-04-02 | Discharge: 2019-04-04 | DRG: 948 | Disposition: A | Discharge status: Home / Self Care | Payer: Medicaid Other | Attending: Surgery | Admitting: Surgery

## 2019-04-02 ENCOUNTER — Other Ambulatory Visit: Payer: Self-pay

## 2019-04-02 DIAGNOSIS — K449 Diaphragmatic hernia without obstruction or gangrene: Secondary | ICD-10-CM | POA: Diagnosis present

## 2019-04-02 DIAGNOSIS — E7800 Pure hypercholesterolemia, unspecified: Secondary | ICD-10-CM | POA: Diagnosis present

## 2019-04-02 DIAGNOSIS — Z87891 Personal history of nicotine dependence: Secondary | ICD-10-CM

## 2019-04-02 DIAGNOSIS — Z886 Allergy status to analgesic agent status: Secondary | ICD-10-CM

## 2019-04-02 DIAGNOSIS — M8580 Other specified disorders of bone density and structure, unspecified site: Secondary | ICD-10-CM | POA: Diagnosis present

## 2019-04-02 DIAGNOSIS — E785 Hyperlipidemia, unspecified: Secondary | ICD-10-CM | POA: Diagnosis present

## 2019-04-02 DIAGNOSIS — Z8261 Family history of arthritis: Secondary | ICD-10-CM

## 2019-04-02 DIAGNOSIS — Z888 Allergy status to other drugs, medicaments and biological substances status: Secondary | ICD-10-CM

## 2019-04-02 DIAGNOSIS — J449 Chronic obstructive pulmonary disease, unspecified: Secondary | ICD-10-CM | POA: Diagnosis present

## 2019-04-02 DIAGNOSIS — Z825 Family history of asthma and other chronic lower respiratory diseases: Secondary | ICD-10-CM

## 2019-04-02 DIAGNOSIS — Z823 Family history of stroke: Secondary | ICD-10-CM

## 2019-04-02 DIAGNOSIS — Z91041 Radiographic dye allergy status: Secondary | ICD-10-CM

## 2019-04-02 DIAGNOSIS — F419 Anxiety disorder, unspecified: Secondary | ICD-10-CM | POA: Diagnosis present

## 2019-04-02 DIAGNOSIS — Z8349 Family history of other endocrine, nutritional and metabolic diseases: Secondary | ICD-10-CM

## 2019-04-02 DIAGNOSIS — Z91013 Allergy to seafood: Secondary | ICD-10-CM

## 2019-04-02 DIAGNOSIS — K219 Gastro-esophageal reflux disease without esophagitis: Secondary | ICD-10-CM | POA: Diagnosis present

## 2019-04-02 DIAGNOSIS — Z7951 Long term (current) use of inhaled steroids: Secondary | ICD-10-CM

## 2019-04-02 DIAGNOSIS — Z885 Allergy status to narcotic agent status: Secondary | ICD-10-CM

## 2019-04-02 DIAGNOSIS — G8918 Other acute postprocedural pain: Principal | ICD-10-CM | POA: Diagnosis present

## 2019-04-02 DIAGNOSIS — I129 Hypertensive chronic kidney disease with stage 1 through stage 4 chronic kidney disease, or unspecified chronic kidney disease: Secondary | ICD-10-CM | POA: Diagnosis present

## 2019-04-02 DIAGNOSIS — Z841 Family history of disorders of kidney and ureter: Secondary | ICD-10-CM

## 2019-04-02 DIAGNOSIS — Z881 Allergy status to other antibiotic agents status: Secondary | ICD-10-CM

## 2019-04-02 DIAGNOSIS — Z20822 Contact with and (suspected) exposure to covid-19: Secondary | ICD-10-CM | POA: Diagnosis present

## 2019-04-02 DIAGNOSIS — L03311 Cellulitis of abdominal wall: Secondary | ICD-10-CM | POA: Diagnosis present

## 2019-04-02 DIAGNOSIS — Z8249 Family history of ischemic heart disease and other diseases of the circulatory system: Secondary | ICD-10-CM

## 2019-04-02 DIAGNOSIS — Z800 Family history of malignant neoplasm of digestive organs: Secondary | ICD-10-CM

## 2019-04-02 DIAGNOSIS — Z818 Family history of other mental and behavioral disorders: Secondary | ICD-10-CM

## 2019-04-02 DIAGNOSIS — Z811 Family history of alcohol abuse and dependence: Secondary | ICD-10-CM

## 2019-04-02 DIAGNOSIS — R109 Unspecified abdominal pain: Secondary | ICD-10-CM | POA: Diagnosis present

## 2019-04-02 DIAGNOSIS — Z91040 Latex allergy status: Secondary | ICD-10-CM

## 2019-04-02 DIAGNOSIS — N183 Chronic kidney disease, stage 3 unspecified: Secondary | ICD-10-CM | POA: Diagnosis present

## 2019-04-02 DIAGNOSIS — Z8279 Family history of other congenital malformations, deformations and chromosomal abnormalities: Secondary | ICD-10-CM

## 2019-04-02 DIAGNOSIS — Z882 Allergy status to sulfonamides status: Secondary | ICD-10-CM

## 2019-04-02 LAB — CBC WITH DIFFERENTIAL/PLATELET
Abs Immature Granulocytes: 0.05 10*3/uL (ref 0.00–0.07)
Basophils Absolute: 0.1 10*3/uL (ref 0.0–0.1)
Basophils Relative: 0 %
Eosinophils Absolute: 0 10*3/uL (ref 0.0–0.5)
Eosinophils Relative: 0 %
HCT: 43.5 % (ref 36.0–46.0)
Hemoglobin: 13.3 g/dL (ref 12.0–15.0)
Immature Granulocytes: 0 %
Lymphocytes Relative: 15 %
Lymphs Abs: 2.1 10*3/uL (ref 0.7–4.0)
MCH: 28.1 pg (ref 26.0–34.0)
MCHC: 30.6 g/dL (ref 30.0–36.0)
MCV: 92 fL (ref 80.0–100.0)
Monocytes Absolute: 1.4 10*3/uL — ABNORMAL HIGH (ref 0.1–1.0)
Monocytes Relative: 10 %
Neutro Abs: 10.5 10*3/uL — ABNORMAL HIGH (ref 1.7–7.7)
Neutrophils Relative %: 75 %
Platelets: 317 10*3/uL (ref 150–400)
RBC: 4.73 MIL/uL (ref 3.87–5.11)
RDW: 14.2 % (ref 11.5–15.5)
WBC: 14.1 10*3/uL — ABNORMAL HIGH (ref 4.0–10.5)
nRBC: 0 % (ref 0.0–0.2)

## 2019-04-02 LAB — COMPREHENSIVE METABOLIC PANEL
ALT: 29 U/L (ref 0–44)
AST: 18 U/L (ref 15–41)
Albumin: 4.3 g/dL (ref 3.5–5.0)
Alkaline Phosphatase: 64 U/L (ref 38–126)
Anion gap: 9 (ref 5–15)
BUN: 17 mg/dL (ref 8–23)
CO2: 28 mmol/L (ref 22–32)
Calcium: 9.2 mg/dL (ref 8.9–10.3)
Chloride: 107 mmol/L (ref 98–111)
Creatinine, Ser: 1.16 mg/dL — ABNORMAL HIGH (ref 0.44–1.00)
GFR calc Af Amer: 59 mL/min — ABNORMAL LOW (ref >60)
GFR calc non Af Amer: 51 mL/min — ABNORMAL LOW (ref >60)
Glucose, Bld: 109 mg/dL — ABNORMAL HIGH (ref 70–99)
Potassium: 4.1 mmol/L (ref 3.5–5.1)
Sodium: 144 mmol/L (ref 135–145)
Total Bilirubin: 0.8 mg/dL (ref 0.3–1.2)
Total Protein: 8.1 g/dL (ref 6.5–8.1)

## 2019-04-02 LAB — LACTIC ACID, PLASMA: Lactic Acid, Venous: 1.1 mmol/L (ref 0.5–1.9)

## 2019-04-02 MED ORDER — FENTANYL CITRATE (PF) 100 MCG/2ML IJ SOLN
50.0000 ug | Freq: Once | INTRAMUSCULAR | Status: AC
  Administered 2019-04-02: 50 ug via INTRAVENOUS
  Filled 2019-04-02: qty 2

## 2019-04-02 MED ORDER — DIPHENHYDRAMINE HCL 25 MG PO CAPS: 50.0000 mg | ORAL_CAPSULE | Freq: Once | ORAL | Status: DC

## 2019-04-02 MED ORDER — HYDROCORTISONE NA SUCCINATE PF 250 MG IJ SOLR
200.0000 mg | Freq: Once | INTRAMUSCULAR | Status: DC
  Filled 2019-04-02: qty 200

## 2019-04-02 MED ORDER — DIPHENHYDRAMINE HCL 50 MG/ML IJ SOLN: 50.0000 mg | Freq: Once | INTRAMUSCULAR | Status: DC

## 2019-04-02 NOTE — ED Triage Notes (Signed)
Pt in via POV, reports having triple hernia repair yesterday, since with severe pain, chills, tremors.  Reports being advised to be evaluated in ED per surgeons office.  NAD noted at this time.

## 2019-04-02 NOTE — ED Notes (Signed)
Pt reports laprosopic hernia repair on the abdomen with 4 incisions yesterday. Pt states she has taken tramadol and tylenol today for pain with no relief. Pt states she has tremors, cold chills, and pain in the stomach rated 10/10. Pt placed in bed and connected to monitor.Pt is accompanied by husband.

## 2019-04-02 NOTE — ED Notes (Signed)
This tech applied female purewick. Explained procedure to patient. Pericare provided.

## 2019-04-03 ENCOUNTER — Observation Stay: Payer: Medicaid Other

## 2019-04-03 ENCOUNTER — Encounter: Payer: Self-pay | Admitting: Surgery

## 2019-04-03 DIAGNOSIS — Z811 Family history of alcohol abuse and dependence: Secondary | ICD-10-CM | POA: Diagnosis not present

## 2019-04-03 DIAGNOSIS — Z886 Allergy status to analgesic agent status: Secondary | ICD-10-CM | POA: Diagnosis not present

## 2019-04-03 DIAGNOSIS — Z885 Allergy status to narcotic agent status: Secondary | ICD-10-CM | POA: Diagnosis not present

## 2019-04-03 DIAGNOSIS — Z7951 Long term (current) use of inhaled steroids: Secondary | ICD-10-CM | POA: Diagnosis not present

## 2019-04-03 DIAGNOSIS — M8580 Other specified disorders of bone density and structure, unspecified site: Secondary | ICD-10-CM | POA: Diagnosis present

## 2019-04-03 DIAGNOSIS — Z20822 Contact with and (suspected) exposure to covid-19: Secondary | ICD-10-CM | POA: Diagnosis present

## 2019-04-03 DIAGNOSIS — G8918 Other acute postprocedural pain: Secondary | ICD-10-CM | POA: Diagnosis not present

## 2019-04-03 DIAGNOSIS — Z91041 Radiographic dye allergy status: Secondary | ICD-10-CM | POA: Diagnosis not present

## 2019-04-03 DIAGNOSIS — N183 Chronic kidney disease, stage 3 unspecified: Secondary | ICD-10-CM | POA: Diagnosis present

## 2019-04-03 DIAGNOSIS — Z882 Allergy status to sulfonamides status: Secondary | ICD-10-CM | POA: Diagnosis not present

## 2019-04-03 DIAGNOSIS — Z91013 Allergy to seafood: Secondary | ICD-10-CM | POA: Diagnosis not present

## 2019-04-03 DIAGNOSIS — Z91040 Latex allergy status: Secondary | ICD-10-CM | POA: Diagnosis not present

## 2019-04-03 DIAGNOSIS — F419 Anxiety disorder, unspecified: Secondary | ICD-10-CM | POA: Diagnosis present

## 2019-04-03 DIAGNOSIS — K219 Gastro-esophageal reflux disease without esophagitis: Secondary | ICD-10-CM | POA: Diagnosis present

## 2019-04-03 DIAGNOSIS — E785 Hyperlipidemia, unspecified: Secondary | ICD-10-CM | POA: Diagnosis present

## 2019-04-03 DIAGNOSIS — Z841 Family history of disorders of kidney and ureter: Secondary | ICD-10-CM | POA: Diagnosis not present

## 2019-04-03 DIAGNOSIS — J449 Chronic obstructive pulmonary disease, unspecified: Secondary | ICD-10-CM | POA: Diagnosis present

## 2019-04-03 DIAGNOSIS — E7800 Pure hypercholesterolemia, unspecified: Secondary | ICD-10-CM | POA: Diagnosis present

## 2019-04-03 DIAGNOSIS — Z881 Allergy status to other antibiotic agents status: Secondary | ICD-10-CM | POA: Diagnosis not present

## 2019-04-03 DIAGNOSIS — L03311 Cellulitis of abdominal wall: Secondary | ICD-10-CM | POA: Diagnosis present

## 2019-04-03 DIAGNOSIS — I129 Hypertensive chronic kidney disease with stage 1 through stage 4 chronic kidney disease, or unspecified chronic kidney disease: Secondary | ICD-10-CM | POA: Diagnosis present

## 2019-04-03 DIAGNOSIS — K449 Diaphragmatic hernia without obstruction or gangrene: Secondary | ICD-10-CM | POA: Diagnosis present

## 2019-04-03 DIAGNOSIS — Z888 Allergy status to other drugs, medicaments and biological substances status: Secondary | ICD-10-CM | POA: Diagnosis not present

## 2019-04-03 DIAGNOSIS — Z818 Family history of other mental and behavioral disorders: Secondary | ICD-10-CM | POA: Diagnosis not present

## 2019-04-03 LAB — URINALYSIS, COMPLETE (UACMP) WITH MICROSCOPIC
Bilirubin Urine: NEGATIVE
Glucose, UA: NEGATIVE mg/dL
Hgb urine dipstick: NEGATIVE
Ketones, ur: NEGATIVE mg/dL
Leukocytes,Ua: NEGATIVE
Nitrite: NEGATIVE
Protein, ur: NEGATIVE mg/dL
Specific Gravity, Urine: 1.012 (ref 1.005–1.030)
pH: 5 (ref 5.0–8.0)

## 2019-04-03 LAB — POC SARS CORONAVIRUS 2 AG: SARS Coronavirus 2 Ag: NEGATIVE

## 2019-04-03 LAB — BASIC METABOLIC PANEL
Anion gap: 9 (ref 5–15)
BUN: 16 mg/dL (ref 8–23)
CO2: 26 mmol/L (ref 22–32)
Calcium: 8.6 mg/dL — ABNORMAL LOW (ref 8.9–10.3)
Chloride: 107 mmol/L (ref 98–111)
Creatinine, Ser: 1.08 mg/dL — ABNORMAL HIGH (ref 0.44–1.00)
GFR calc Af Amer: >60 mL/min (ref >60)
GFR calc non Af Amer: 55 mL/min — ABNORMAL LOW (ref >60)
Glucose, Bld: 126 mg/dL — ABNORMAL HIGH (ref 70–99)
Potassium: 4.1 mmol/L (ref 3.5–5.1)
Sodium: 142 mmol/L (ref 135–145)

## 2019-04-03 LAB — CBC
HCT: 40.7 % (ref 36.0–46.0)
Hemoglobin: 12.8 g/dL (ref 12.0–15.0)
MCH: 28.3 pg (ref 26.0–34.0)
MCHC: 31.4 g/dL (ref 30.0–36.0)
MCV: 89.8 fL (ref 80.0–100.0)
Platelets: 276 10*3/uL (ref 150–400)
RBC: 4.53 MIL/uL (ref 3.87–5.11)
RDW: 14.4 % (ref 11.5–15.5)
WBC: 16.6 10*3/uL — ABNORMAL HIGH (ref 4.0–10.5)
nRBC: 0 % (ref 0.0–0.2)

## 2019-04-03 LAB — PHOSPHORUS: Phosphorus: 2.8 mg/dL (ref 2.5–4.6)

## 2019-04-03 LAB — MAGNESIUM: Magnesium: 2.4 mg/dL (ref 1.7–2.4)

## 2019-04-03 MED ORDER — ONDANSETRON HCL 4 MG/2ML IJ SOLN: 4.0000 mg | Freq: Four times a day (QID) | INTRAMUSCULAR | Status: DC | PRN

## 2019-04-03 MED ORDER — IPRATROPIUM-ALBUTEROL 0.5-2.5 (3) MG/3ML IN SOLN
3.0000 mL | RESPIRATORY_TRACT | Status: DC | PRN
  Administered 2019-04-03: 3 mL via RESPIRATORY_TRACT
  Filled 2019-04-03: qty 3

## 2019-04-03 MED ORDER — TIOTROPIUM BROMIDE MONOHYDRATE 18 MCG IN CAPS
18.0000 ug | ORAL_CAPSULE | Freq: Every day | RESPIRATORY_TRACT | Status: DC
  Administered 2019-04-03: 18 ug via RESPIRATORY_TRACT
  Filled 2019-04-03 (×2): qty 5

## 2019-04-03 MED ORDER — TECHNETIUM TO 99M ALBUMIN AGGREGATED
4.0000 | Freq: Once | INTRAVENOUS | Status: AC | PRN
  Administered 2019-04-03: 3.87 via INTRAVENOUS
  Filled 2019-04-03: qty 4

## 2019-04-03 MED ORDER — ALBUTEROL SULFATE HFA 108 (90 BASE) MCG/ACT IN AERS: 2.0000 | INHALATION_SPRAY | RESPIRATORY_TRACT | Status: DC | PRN

## 2019-04-03 MED ORDER — DOCUSATE SODIUM 100 MG PO CAPS: 100.0000 mg | ORAL_CAPSULE | Freq: Two times a day (BID) | ORAL | Status: DC | PRN

## 2019-04-03 MED ORDER — TECHNETIUM TC 99M DIETHYLENETRIAME-PENTAACETIC ACID
30.0000 | Freq: Once | INTRAVENOUS | Status: AC | PRN
  Administered 2019-04-03: 11:00:00 32.913 via RESPIRATORY_TRACT
  Filled 2019-04-03: qty 30

## 2019-04-03 MED ORDER — ONDANSETRON 4 MG PO TBDP: 4.0000 mg | ORAL_TABLET | Freq: Four times a day (QID) | ORAL | Status: DC | PRN

## 2019-04-03 MED ORDER — ACETAMINOPHEN 325 MG PO TABS
650.0000 mg | ORAL_TABLET | Freq: Three times a day (TID) | ORAL | Status: DC | PRN
  Administered 2019-04-03: 650 mg via ORAL
  Filled 2019-04-03: qty 2

## 2019-04-03 MED ORDER — ALPRAZOLAM 0.5 MG PO TABS
1.0000 mg | ORAL_TABLET | Freq: Two times a day (BID) | ORAL | Status: DC
  Administered 2019-04-03 – 2019-04-04 (×4): 1 mg via ORAL
  Filled 2019-04-03: qty 4
  Filled 2019-04-03 (×2): qty 2
  Filled 2019-04-03: qty 4

## 2019-04-03 MED ORDER — MOMETASONE FURO-FORMOTEROL FUM 200-5 MCG/ACT IN AERO
2.0000 | INHALATION_SPRAY | Freq: Two times a day (BID) | RESPIRATORY_TRACT | Status: DC
  Administered 2019-04-03 – 2019-04-04 (×3): 2 via RESPIRATORY_TRACT
  Filled 2019-04-03 (×2): qty 8.8

## 2019-04-03 MED ORDER — SODIUM CHLORIDE 0.9 % IV SOLN: INTRAVENOUS | Status: DC

## 2019-04-03 MED ORDER — ALBUTEROL SULFATE (2.5 MG/3ML) 0.083% IN NEBU: 2.5000 mg | INHALATION_SOLUTION | Freq: Four times a day (QID) | RESPIRATORY_TRACT | Status: DC | PRN

## 2019-04-03 MED ORDER — TRAMADOL HCL 50 MG PO TABS
50.0000 mg | ORAL_TABLET | Freq: Four times a day (QID) | ORAL | Status: DC | PRN
  Administered 2019-04-03 – 2019-04-04 (×2): 50 mg via ORAL
  Filled 2019-04-03 (×2): qty 1

## 2019-04-03 MED ORDER — ENOXAPARIN SODIUM 40 MG/0.4ML ~~LOC~~ SOLN
40.0000 mg | SUBCUTANEOUS | Status: DC
  Administered 2019-04-03 – 2019-04-04 (×2): 40 mg via SUBCUTANEOUS
  Filled 2019-04-03 (×2): qty 0.4

## 2019-04-03 MED ORDER — MORPHINE SULFATE (PF) 2 MG/ML IV SOLN: 2.0000 mg | INTRAVENOUS | Status: DC | PRN

## 2019-04-03 MED ORDER — METOPROLOL SUCCINATE ER 100 MG PO TB24
100.0000 mg | ORAL_TABLET | Freq: Every day | ORAL | Status: DC
  Administered 2019-04-03 – 2019-04-04 (×2): 100 mg via ORAL
  Filled 2019-04-03 (×2): qty 2
  Filled 2019-04-03: qty 1

## 2019-04-03 NOTE — H&P (Signed)
Subjective:   CC: Fever and shaking  HPI:  Kylie [name] is a 64 y.o. female who presented back to the clinic postop day 1 robotic assisted laparoscopic ventral hernia repair.  Husband and patient complains of persistent shaking accompanied by a reported fever of 101.  She states the pain around the abdomen has been pretty consistent with as well with no relief from Ultram.  She otherwise has been tolerating a diet.  Also started complaining of a rash that has developed in bilateral extremities but has since resolved.  She has 1 spot in her neck where there is a persistent blister that they believe developed after the surgery.   Past Medical History:  has a past medical history of Anemia, Anxiety, Asthma, AVM (arteriovenous malformation) (March 2016), Chronic kidney disease, stage II (mild), CKD (chronic kidney disease) stage 3, GFR 30-59 ml/min, COPD (chronic obstructive pulmonary disease) (HCC), Depression, Diverticulitis, GERD (gastroesophageal reflux disease), Heart murmur, Hiatal hernia, Hypercholesteremia, Hyperlipidemia, Hypertension, Leaky heart valve, Low serum cortisol level (HCC) (06/03/2015), Murmur, Osteopenia determined by x-ray (03/27/2016), Overactive bladder, and Panic attacks.  Past Surgical History:  Past Surgical History:  Procedure Laterality Date  . ABDOMINAL HYSTERECTOMY  2007   Total with BSO  . APPENDECTOMY  2006  . CARDIAC CATHETERIZATION N/A 01/07/2015   Procedure: Left Heart Cath and Coronary Angiography;  Surgeon: Lamar Blinks, MD;  Location: [facility] INVASIVE CV LAB;  Service: Cardiovascular;  Laterality: N/A;  . COLONOSCOPY  2013   Dr Wandalee Ferdinand at [facility]  . SIGMOID RESECTION / RECTOPEXY  10/17/13  . TUBAL LIGATION  1981  . UPPER GI ENDOSCOPY  2013   Dr Wandalee Ferdinand at [facility]  . XI ROBOTIC ASSISTED VENTRAL HERNIA N/A 04/01/2019   Procedure: XI ROBOTIC ASSISTED VENTRAL HERNIA;  Surgeon: Sung Amabile, DO;  Location: [facility] ORS;  Service: General;  Laterality: N/A;     Family History: family history includes Alcohol abuse in her father; Arthritis in her father and sister; Asthma in her sister; Cancer in her mother; Diverticulitis in her father; Down syndrome in her brother; Heart disease in her brother and sister; Hyperlipidemia in her brother, brother, and sister; Hypertension in her brother, brother, daughter, sister, sister, sister, sister, sister, and son; Kidney disease in her mother; Mental illness in her father; Migraines in her sister; Stroke in her brother.  Social History:  reports that she quit smoking about 8 years ago. Her smoking use included cigarettes. She has a 50.00 pack-year smoking history. She has never used smokeless tobacco. She reports that she does not drink alcohol or use drugs.  Current Medications: (Not in a hospital admission)   Allergies:  Allergies as of 04/02/2019 - Review Complete 04/02/2019  Allergen Reaction Noted  . Cephalexin Rash 09/08/2014  . Fish allergy Hives 01/06/2015  . Ivp dye [iodinated diagnostic agents] Other (See Comments) and Anaphylaxis 09/30/2013  . Nitrofurantoin Rash 09/08/2014  . Sulfa antibiotics Hives and Rash 09/30/2013  . Aspirin Other (See Comments) 09/08/2014  . Ciprofloxacin Hives 09/30/2013  . Doxycycline  09/08/2014  . Other Other (See Comments) 09/30/2013  . Oxycodone Other (See Comments) 09/08/2014  . Percocet [oxycodone-acetaminophen] Other (See Comments) 09/30/2013  . Prednisone Nausea Only 03/27/2016  . Tequin [gatifloxacin] Hives 09/30/2013  . Latex Rash 09/30/2013  . Myrbetriq [mirabegron] Rash 09/08/2014  . Premarin [conjugated estrogens] Rash 09/30/2013    ROS:  General: Denies weight loss, weight gain, fatigue, fevers, chills, and night sweats. Eyes: Denies blurry vision, double vision,  eye pain, itchy eyes, and tearing. Ears: Denies hearing loss, earache, and ringing in ears. Nose: Denies sinus pain, congestion, infections, runny nose, and nosebleeds. Mouth/throat:  Denies hoarseness, sore throat, bleeding gums, and difficulty swallowing. Heart: Denies chest pain, palpitations, racing heart, irregular heartbeat, leg pain or swelling, and decreased activity tolerance. Respiratory: Denies breathing difficulty, shortness of breath, wheezing, cough, and sputum. GI: Denies change in appetite, heartburn, nausea, vomiting, constipation, diarrhea, and blood in stool. GU: Denies difficulty urinating, pain with urinating, urgency, frequency, blood in urine. Musculoskeletal: Denies joint stiffness, pain, swelling, muscle weakness. Skin: Denies itching, mass, tumors, sores, and boils Neurologic: Denies headache, fainting, dizziness, seizures, numbness, and tingling. Psychiatric: Denies depression, anxiety, difficulty sleeping, and memory loss. Endocrine: Denies heat or cold intolerance, and increased thirst or urination. Blood/lymph: Denies easy bruising, easy bruising, and swollen glands     Objective:     BP (!) 181/85   Pulse (!) 106   Temp 98.7 F (37.1 C)   Resp (!) 27   Ht 4\' 10"  (1.473 m)   Wt 78.5 kg   SpO2 99%   BMI 36.16 kg/m   Constitutional :  alert, cooperative, appears stated age and no distress  Lymphatics/Throat:  no asymmetry, masses, or scars  Respiratory:  clear to auscultation bilaterally  Cardiovascular:  regular rate and rhythm  Gastrointestinal: Soft, no guarding, appropriate tenderness around the former hernia site as well as along the port site incisions on the lateral aspect.  All port sites clean dry and intact.  No erythema, fluctuance,.   Musculoskeletal: Steady movement  Skin: Cool and moist.  No visible rashes in the area of concern, 2 punctate folliculitis-like lesions noted along the left lateral neck area where her nasal cannula tubing is located.  Psychiatric: Normal affect, non-agitated, not confused       LABS:  CMP Latest Ref Rng & Units 04/02/2019 03/22/2019 05/24/2016  Glucose 70 - 99 mg/dL 109(H) 124(H) 112(H)   BUN 8 - 23 mg/dL 17 25(H) 17  Creatinine 0.44 - 1.00 mg/dL 1.16(H) 1.39(H) 1.13(H)  Sodium 135 - 145 mmol/L 144 137 140  Potassium 3.5 - 5.1 mmol/L 4.1 4.3 3.8  Chloride 98 - 111 mmol/L 107 100 103  CO2 22 - 32 mmol/L 28 24 31   Calcium 8.9 - 10.3 mg/dL 9.2 9.6 9.5  Total Protein 6.5 - 8.1 g/dL 8.1 8.6(H) -  Total Bilirubin 0.3 - 1.2 mg/dL 0.8 0.8 -  Alkaline Phos 38 - 126 U/L 64 66 -  AST 15 - 41 U/L 18 24 -  ALT 0 - 44 U/L 29 27 -   CBC Latest Ref Rng & Units 04/02/2019 03/22/2019 05/24/2016  WBC 4.0 - 10.5 K/uL 14.1(H) 11.2(H) 10.6  Hemoglobin 12.0 - 15.0 g/dL 13.3 14.1 13.7  Hematocrit 36.0 - 46.0 % 43.5 44.7 41.7  Platelets 150 - 400 K/uL 317 264 260    RADS: Pending VQ scan. Assessment:   Fever and shaking.  Day 1 robotic assisted laparoscopic ventral hernia repair with mesh.  Plan:   Physical exam is unremarkable and the degree of tenderness reported in the abdomen during physical exam as expected postoperative pain.  No clear source for the reported fever of 101.  Patient and husband are greatly concerned that they do not feel comfortable continuing monitoring as an outpatient as initially recommended.  Therefore patient was transferred to the ED for further work-up, at which point she reported to the ED provider episodes of shortness of breath and dyspnea  that is new to her.  Therefore, we will proceed with a VQ scan due to her CT contrast allergies to ensure no concerns for a possible development of a PE.  She is at a high risk for due to her previous history.  We will place her in a house until the VQ scan can be arranged.  IV fluids and n.p.o. in the meantime, with serial abdominal exams.  At this point there is no indication of intra-abdominal infection or any other infectious source as the cause of the fever so we will forego any antibiotics.

## 2019-04-03 NOTE — ED Notes (Signed)
On call for VQ returned call and stated that someone would be in as scheduled around 7am and that medication had to be ordered making the scan around 10am - admitting provider messaged

## 2019-04-03 NOTE — Progress Notes (Signed)
Subjective:  CC: Kylie [name] is a 64 y.o. female  Hospital stay day 0,     HPI: Notified by RN of increasing HR and RR.  She now feels heart is racing.  No change in pain.  Rest of ROS as noted below.  ROS:  General: Denies weight loss, weight gain, fatigue, fevers, chills, and night sweats. Heart: Denies chest pain, palpitations, racing heart, irregular heartbeat, leg pain or swelling, and decreased activity tolerance. Respiratory: Denies breathing difficulty, shortness of breath, wheezing, cough, and sputum. GI: Denies change in appetite, heartburn, nausea, vomiting, constipation, diarrhea, and blood in stool. GU: Denies difficulty urinating, pain with urinating, urgency, frequency, blood in urine.   Objective:   Temp:  [98.7 F (37.1 C)-99.4 F (37.4 C)] 98.7 F (37.1 C) (03/24 1954) Pulse Rate:  [97-123] 106 (03/25 0130) Resp:  [20-32] 27 (03/25 0130) BP: (154-184)/(72-89) 181/85 (03/25 0130) SpO2:  [98 %-100 %] 99 % (03/25 0130) Weight:  [78.5 kg] 78.5 kg (03/24 1646)     Height: 4\' 10"  (147.3 cm) Weight: 78.5 kg BMI (Calculated): 36.17   Intake/Output this shift:  No intake or output data in the 24 hours ending 04/03/19 0448  Constitutional :  alert, cooperative, appears stated age and no distress  Respiratory:  clear to auscultation bilaterally  Cardiovascular:  regular rate and rhythm  Gastrointestinal: soft, no guarding, focal TTP around former hernia site and less so at incsion sites.  all c/d/i.04/05/19   Skin: Cool and moist.   Psychiatric: Normal affect, non-agitated, not confused       LABS:  CMP Latest Ref Rng & Units 04/02/2019 03/22/2019 05/24/2016  Glucose 70 - 99 mg/dL 05/26/2016) 301(S) 010(X)  BUN 8 - 23 mg/dL 17 323(F) 17  Creatinine 0.44 - 1.00 mg/dL 57(D) 2.20(U) 5.42(H)  Sodium 135 - 145 mmol/L 144 137 140  Potassium 3.5 - 5.1 mmol/L 4.1 4.3 3.8  Chloride 98 - 111 mmol/L 107 100 103  CO2 22 - 32 mmol/L 28 24 31   Calcium 8.9 - 10.3 mg/dL 9.2 9.6 9.5   Total Protein 6.5 - 8.1 g/dL 8.1 0.62(B) -  Total Bilirubin 0.3 - 1.2 mg/dL 0.8 0.8 -  Alkaline Phos 38 - 126 U/L 64 66 -  AST 15 - 41 U/L 18 24 -  ALT 0 - 44 U/L 29 27 -   CBC Latest Ref Rng & Units 04/02/2019 03/22/2019 05/24/2016  WBC 4.0 - 10.5 K/uL 14.1(H) 11.2(H) 10.6  Hemoglobin 12.0 - 15.0 g/dL 03/24/2019 05/26/2016 83.1  Hematocrit 36.0 - 46.0 % 43.5 44.7 41.7  Platelets 150 - 400 K/uL 317 264 260    RADS: pendin CXR Assessment:   Post op pain, SOB, fever.  Pt now is slightly more tachy, with increased RR consistently.  Pending CXR, EKG showed sinus tach.  No change in pain overall and patient generally appears unchanged from earlier today.  Will await V/Q scan and also f/u on U/A for now. Continue close monitoring

## 2019-04-03 NOTE — ED Notes (Signed)
Surgery at bedside.

## 2019-04-03 NOTE — ED Notes (Signed)
Report given to [facility] receiving RN.

## 2019-04-03 NOTE — ED Notes (Signed)
Visual merchandiser in reference to see if the VQ scan can be sooner per MD to r/o potential of clot.

## 2019-04-03 NOTE — ED Notes (Signed)
Called pharmacy to send Dulera 

## 2019-04-03 NOTE — ED Notes (Signed)
VQ called back and stated that pt needed a covid test within the last 72 hours or they could not perform scan - admitting provider messaged to place order

## 2019-04-03 NOTE — ED Notes (Signed)
Paged inpatient provider in reference to pt increase in RR and HR.

## 2019-04-03 NOTE — ED Provider Notes (Signed)
[facility] Emergency Department Provider Note    ____________________________________________   I have reviewed the triage vital signs and the nursing notes.   HISTORY  Chief Complaint Post-op Problem   History limited by: Not Limited   HPI Kylie [name] is a 64 y.o. female who presents to the emergency department today because of concern for abdominal pain in the setting of recent surgery. She underwent hernia surgery yesterday. States that her pain has not been controlled since leaving the hospital. Additionally the patient says that she has noticed some swelling in her arms and legs. Also complaining of some chest pain. Husband also states that the patient had a fever to 101 last night.    Records reviewed. Per medical record review patient has a history of surgery performed yesterday.   Past Medical History:  Diagnosis Date  . Anemia   . Anxiety    followed by Dr. Janeece Riggers  . Asthma   . 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 Chronic kidney disease, stage II (mild)   . CKD (chronic kidney disease) stage 3, GFR 30-59 ml/min   . COPD (chronic obstructive pulmonary disease) (HCC)   . Depression    followed by Dr. Janeece Riggers  . Diverticulitis    hospitalized several times  . GERD (gastroesophageal reflux disease)   . Heart murmur   . Hiatal hernia   . Hypercholesteremia   . Hyperlipidemia   . Hypertension   . Leaky heart valve   . Low serum cortisol level (HCC) 06/03/2015  . Murmur   . Osteopenia determined by x-ray 03/27/2016  . Overactive bladder   . Panic attacks    followed by Dr. Janeece Riggers    Patient Active Problem List   Diagnosis Date Noted  . Abdominal pain 04/02/2019  . Ventral hernia without obstruction or gangrene 03/24/2019  . Incisional hernia with obstruction 03/22/2019  . Osteopenia determined by x-ray 03/27/2016  . COPD (chronic obstructive pulmonary disease) (HCC) 03/15/2016  . COPD exacerbation (HCC)  03/14/2016  . Medication monitoring encounter 03/07/2016  . Low serum cortisol level (HCC) 06/03/2015  . Vitamin B12 deficiency 06/03/2015  . Hyperlipidemia LDL goal <70 06/03/2015  . Abnormal weight gain 05/20/2015  . Vitamin D deficiency 05/20/2015  . Breast cancer screening 05/20/2015  . IFG (impaired fasting glucose) 02/09/2015  . Striae 02/09/2015  . Elevated red blood cell count 02/09/2015  . Weight gain, abnormal 02/09/2015  . Unstable angina (HCC) 01/07/2015  . Angina at rest [facility]) 01/06/2015  . Vascular calcification 12/29/2014  . Heart murmur 12/29/2014  . Hx of angina pectoris 12/29/2014  . Facial numbness 12/29/2014  . Right arm numbness 12/29/2014  . Right leg numbness 12/29/2014  . Xanthelasma of eyelid 10/09/2014  . AVM (arteriovenous malformation) 09/16/2014  . Essential hypertension, benign 09/16/2014  . Onycholysis of toenail 09/16/2014  . Vaginal atrophy 09/16/2014  . Bilateral hand pain 09/16/2014  . Chronic kidney disease, stage II (mild)   . Hypercholesteremia   . Sigmoid diverticulitis 09/30/2013    Past Surgical History:  Procedure Laterality Date  . ABDOMINAL HYSTERECTOMY  2007   Total with BSO  . APPENDECTOMY  2006  . CARDIAC CATHETERIZATION N/A 01/07/2015   Procedure: Left Heart Cath and Coronary Angiography;  Surgeon: Lamar Blinks, MD;  Location: [facility] INVASIVE CV LAB;  Service: Cardiovascular;  Laterality: N/A;  . COLONOSCOPY  2013   Dr Wandalee Ferdinand at [facility] GI  . SIGMOID RESECTION / RECTOPEXY  10/17/13  . TUBAL LIGATION  1981  . UPPER GI ENDOSCOPY  2013   Dr Acquanetta Sit at [place]  . XI ROBOTIC ASSISTED VENTRAL HERNIA N/A 04/01/2019   Procedure: XI ROBOTIC ASSISTED VENTRAL HERNIA;  Surgeon: Benjamine Sprague, DO;  Location: [facility] ORS;  Service: General;  Laterality: N/A;    Prior to Admission medications   Medication Sig Start Date End Date Taking? Authorizing Provider  acetaminophen (TYLENOL) 325 MG tablet Take 2 tablets (650 mg total) by mouth  every 8 (eight) hours as needed for mild pain. 04/01/19 05/01/19 Yes Sakai, Isami, DO  albuterol (PROVENTIL) (2.5 MG/3ML) 0.083% nebulizer solution Take 3 mLs (2.5 mg total) by nebulization every 6 (six) hours as needed for wheezing or shortness of breath. 03/27/16  Yes Lada, Satira Anis, MD  ALPRAZolam Duanne Moron) 1 MG tablet Take 1 mg by mouth 3 (three) times daily as needed for anxiety.  09/02/13  Yes [provider]  azithromycin (ZITHROMAX) 250 MG tablet Take 250 mg by mouth every Monday, Wednesday, and Friday.   Yes [provider]  calcium carbonate (TUMS - DOSED IN MG ELEMENTAL CALCIUM) 500 MG chewable tablet Chew 1 tablet by mouth as needed for indigestion or heartburn.   Yes [provider]  docusate sodium (COLACE) 100 MG capsule Take 1 capsule (100 mg total) by mouth 2 (two) times daily as needed for up to 10 days for mild constipation. 04/01/19 04/11/19 Yes Sakai, Isami, DO  Fluticasone-Salmeterol (ADVAIR DISKUS) 250-50 MCG/DOSE AEPB Inhale 1 puff into the lungs in the morning and at bedtime. 01/16/19  Yes [provider]  ipratropium-albuterol (DUONEB) 0.5-2.5 (3) MG/3ML SOLN Take 3 mLs by nebulization every 4 (four) hours as needed. Patient taking differently: Take 3 mLs by nebulization every 4 (four) hours as needed (shortness of breath).  07/06/16  Yes Wilhelmina Mcardle, MD  metoprolol succinate (TOPROL-XL) 50 MG 24 hr tablet Take 2 tablets (100 mg total) by mouth daily. Take with or immediately following a meal. 04/01/19  Yes Sakai, Isami, DO  PROAIR HFA 108 (90 Base) MCG/ACT inhaler INHALE 2 PUFFS BY MOUTH EVERY 4 HOURS ASNEEDED FOR WHEEZING OR SHORTNESS OF BREATH Patient taking differently: Inhale 2 puffs into the lungs every 4 (four) hours as needed for wheezing or shortness of breath.  05/15/16  Yes Wilhelmina Mcardle, MD  tiotropium (SPIRIVA) 18 MCG inhalation capsule Place 18 mcg into inhaler and inhale daily. 01/16/19 01/16/20 Yes [provider]  traMADol  (ULTRAM) 50 MG tablet Take 1 tablet (50 mg total) by mouth every 6 (six) hours as needed. 04/01/19 03/31/20 Yes Sakai, Isami, DO    Allergies Cephalexin, Fish allergy, Ivp dye [iodinated diagnostic agents], Nitrofurantoin, Sulfa antibiotics, Aspirin, Ciprofloxacin, Doxycycline, Other, Oxycodone, Percocet [oxycodone-acetaminophen], Prednisone, Tequin [gatifloxacin], Latex, Myrbetriq [mirabegron], and Premarin [conjugated estrogens]  Family History  Problem Relation Age of Onset  . Cancer Mother        colon  . Kidney disease Mother   . Arthritis Father   . Alcohol abuse Father   . Mental illness Father   . Diverticulitis Father   . Asthma Sister   . Hyperlipidemia Sister   . Hypertension Sister   . Migraines Sister   . Heart disease Brother   . Hyperlipidemia Brother   . Hypertension Brother   . Stroke Brother   . Hypertension Daughter   . Hypertension Son   . Hypertension Sister   . Hypertension Sister   . Hypertension Sister   . Arthritis  Sister   . Heart disease Sister   . Hypertension Sister   . Hypertension Brother   . Hyperlipidemia Brother   . Down syndrome Brother     Social History Social History   Tobacco Use  . Smoking status: Former Smoker    Packs/day: 2.00    Years: 25.00    Pack years: 50.00    Types: Cigarettes    Quit date: 01/10/2011    Years since quitting: 8.2  . Smokeless tobacco: Never Used  Substance Use Topics  . Alcohol use: No    Alcohol/week: 0.0 standard drinks  . Drug use: No    Review of Systems Constitutional: Positive fever Eyes: No visual changes. ENT: No sore throat. Cardiovascular: Positive chest pain. Respiratory: Denies shortness of breath. Gastrointestinal: Positive for abdominal pain.  Genitourinary: Negative for dysuria. Musculoskeletal: Negative for back pain. Skin: Negative for rash. Neurological: Negative for headaches, focal weakness or numbness.  ____________________________________________   PHYSICAL  EXAM:  VITAL SIGNS: ED Triage Vitals  Enc Vitals Group     BP 04/02/19 1645 (!) 154/72     Pulse Rate 04/02/19 1645 97     Resp 04/02/19 1645 20     Temp 04/02/19 1645 99.4 F (37.4 C)     Temp Source 04/02/19 1645 Oral     SpO2 04/02/19 1645 100 %     Weight 04/02/19 1646 173 lb (78.5 kg)     Height 04/02/19 1646 4\' 10"  (1.473 m)     Head Circumference --      Peak Flow --      Pain Score 04/02/19 1652 9   Constitutional: Alert and oriented.  Eyes: Conjunctivae are normal.  ENT      Head: Normocephalic and atraumatic.      Nose: No congestion/rhinnorhea.      Mouth/Throat: Mucous membranes are moist.      Neck: No stridor. Hematological/Lymphatic/Immunilogical: No cervical lymphadenopathy. Cardiovascular: Tachycardic, regular rhythm.  No murmurs, rubs, or gallops. Respiratory: Normal respiratory effort without tachypnea nor retractions. Breath sounds are clear and equal bilaterally. No wheezes/rales/rhonchi. Gastrointestinal: Soft and diffusely tender.   Genitourinary: Deferred Musculoskeletal: Normal range of motion in all extremities. No lower extremity edema. Neurologic:  Normal speech and language. No gross focal neurologic deficits are appreciated.  Skin:  Skin is warm, dry and intact. No rash noted. Psychiatric: Mood and affect are normal. Speech and behavior are normal. Patient exhibits appropriate insight and judgment.  ____________________________________________    LABS (pertinent positives/negatives)  Lactic acid 1.1 CBC wbc 14.1, hgb 13.3, plt 317 CMP wnl except glu 109, cr 1.16  ____________________________________________   EKG  None  ____________________________________________    RADIOLOGY  V/q pending at time of admission  ____________________________________________   PROCEDURES  Procedures  ____________________________________________   INITIAL IMPRESSION / ASSESSMENT AND PLAN / ED COURSE  Pertinent labs & imaging results that  were available during my care of the patient were reviewed by me and considered in my medical decision making (see chart for details).   Patient presented to the emergency department today because of concern for abdominal pain. Had hernia surgery yesterday. States pain is not well controlled. Also complaining of chest pain and fever. Discussed with Dr. 04/04/19 with surgery who will admit the patient. Will order v/q scan given concern for possible PE.   ____________________________________________   FINAL CLINICAL IMPRESSION(S) / ED DIAGNOSES  Final diagnoses:  Post-operative pain     Note: This dictation was prepared with Dragon dictation. Any  transcriptional errors that result from this process are unintentional     Phineas Semen, MD 04/03/19 1326

## 2019-04-03 NOTE — Progress Notes (Signed)
Placed pt on CPAP. Pt stated it was too tight, loosened straps. Pt stated it's not comfortable and decided to just wear her oxygen. Pt aware, I will come back and place it back on at anytime. RN informed of pt decision. Cpap remains at bedside.

## 2019-04-03 NOTE — ED Notes (Signed)
Admitting provider messaged by Joslyn Devon RN d/t pt with tachypnea and tachycardia - VO for stat CXR and EKG - admitting provider request that ED doctor read EKG - Dr Dolores Frame signed off on EKG with noted sinus tachycardia otherwise unremarkable - admitting provider messaged with this update

## 2019-04-03 NOTE — Anesthesia Postprocedure Evaluation (Signed)
Anesthesia Post Note  Patient: Kylie [name]  Procedure(s) Performed: XI ROBOTIC ASSISTED VENTRAL HERNIA (N/A Abdomen)  Patient location during evaluation: PACU Anesthesia Type: General Level of consciousness: awake and alert Pain management: pain level controlled Vital Signs Assessment: post-procedure vital signs reviewed and stable Respiratory status: spontaneous breathing, nonlabored ventilation, respiratory function stable and patient connected to nasal cannula oxygen Cardiovascular status: blood pressure returned to baseline and stable Postop Assessment: no apparent nausea or vomiting Anesthetic complications: no     Last Vitals:  Vitals:   04/01/19 1648 04/01/19 1719  BP: 135/67 132/61  Pulse: 86 86  Resp: 18 20  Temp: (!) 36.1 C   SpO2: 99% 99%    Last Pain:  Vitals:   04/02/19 0834  TempSrc:   PainSc: 7                  Yevette Edwards

## 2019-04-03 NOTE — ED Notes (Signed)
Paged surgery in reference to pt current elevated HR and RR.

## 2019-04-04 LAB — CBC
HCT: 38.2 % (ref 36.0–46.0)
Hemoglobin: 11.8 g/dL — ABNORMAL LOW (ref 12.0–15.0)
MCH: 28.6 pg (ref 26.0–34.0)
MCHC: 30.9 g/dL (ref 30.0–36.0)
MCV: 92.5 fL (ref 80.0–100.0)
Platelets: 288 10*3/uL (ref 150–400)
RBC: 4.13 MIL/uL (ref 3.87–5.11)
RDW: 14.4 % (ref 11.5–15.5)
WBC: 14.3 10*3/uL — ABNORMAL HIGH (ref 4.0–10.5)
nRBC: 0 % (ref 0.0–0.2)

## 2019-04-04 LAB — BASIC METABOLIC PANEL
Anion gap: 9 (ref 5–15)
BUN: 17 mg/dL (ref 8–23)
CO2: 25 mmol/L (ref 22–32)
Calcium: 8.2 mg/dL — ABNORMAL LOW (ref 8.9–10.3)
Chloride: 108 mmol/L (ref 98–111)
Creatinine, Ser: 1.13 mg/dL — ABNORMAL HIGH (ref 0.44–1.00)
GFR calc Af Amer: >60 mL/min (ref >60)
GFR calc non Af Amer: 52 mL/min — ABNORMAL LOW (ref >60)
Glucose, Bld: 96 mg/dL (ref 70–99)
Potassium: 3.6 mmol/L (ref 3.5–5.1)
Sodium: 142 mmol/L (ref 135–145)

## 2019-04-04 LAB — MAGNESIUM: Magnesium: 2.5 mg/dL — ABNORMAL HIGH (ref 1.7–2.4)

## 2019-04-04 LAB — PHOSPHORUS: Phosphorus: 2.5 mg/dL (ref 2.5–4.6)

## 2019-04-04 MED ORDER — AMOXICILLIN-POT CLAVULANATE 875-125 MG PO TABS
1.0000 | ORAL_TABLET | Freq: Two times a day (BID) | ORAL | Status: DC
  Filled 2019-04-04: qty 1

## 2019-04-04 MED ORDER — AMOXICILLIN-POT CLAVULANATE 400-57 MG/5ML PO SUSR: 875.0000 mg | Freq: Two times a day (BID) | ORAL | 0 refills | Status: AC

## 2019-04-04 MED ORDER — AMOXICILLIN-POT CLAVULANATE 400-57 MG/5ML PO SUSR
875.0000 mg | Freq: Two times a day (BID) | ORAL | Status: DC
  Administered 2019-04-04: 875 mg via ORAL
  Filled 2019-04-04 (×2): qty 10.9

## 2019-04-04 NOTE — Progress Notes (Signed)
Pt refused CPAP, and is resting comfortably on oxygen.

## 2019-04-04 NOTE — Discharge Summary (Signed)
Physician Discharge Summary  Patient ID: Kylie [name] MRN: [id number] DOB/AGE: [birth year] 64 y.o.  Admit date: 04/02/2019 Discharge date: 04/04/2019  Admission Diagnoses: Fever, shakes  Discharge Diagnoses:  Same as above plus cellulitis  Discharged Condition: good  Hospital Course: Admitted for above.  ED work-up showed persistent tachycardia and tachypnea so PE work-up was started with a VQ scan which was negative.  Patient admitted for further observation.  Vital signs eventually stabilized with a decreasing white count.  Pain also became more manageable as well.  At the time of discharge patient was tolerating a regular diet and pain was well controlled.  However on the latest exam, there is increased erythema noted around the hernia repair site concerning for possible cellulitis, or developing abscess.  Since the white count was improving without any specific intervention, and patient was overall doing well, discussion was had between continued observation in-house versus close observation as an outpatient with antibiotics.  Patient eventually opted for outpatient antibiotic treatment and close monitoring  Consults: None  Discharge Exam: Blood pressure (!) 131/52, pulse 75, temperature 98.4 F (36.9 C), temperature source Oral, resp. rate 20, height 4\' 10"  (1.473 m), weight 78.5 kg, SpO2 100 %. General appearance: alert, cooperative and no distress GI: Soft, no guarding, but with slightly increased erythema noted around the hernia repair site at midline.  Incisions port sites remain clean dry and intact.  Disposition:  Discharge disposition: 01-Home or Self Care        Allergies as of 04/04/2019      Reactions   Cephalexin Rash   Fish Allergy Hives   Ivp Dye [iodinated Diagnostic Agents] Other (See Comments), Anaphylaxis   Blood pressure drops very low eyes roll back in the back of her head cant breathe Shock, low BP   Nitrofurantoin Rash   Sulfa Antibiotics Hives, Rash    Aspirin Other (See Comments)   Stomach issues   Ciprofloxacin Hives   Doxycycline    Other Other (See Comments)   Myobetria/ dizzness   Oxycodone Other (See Comments)   Other Reaction: Not Assessed   Percocet [oxycodone-acetaminophen] Other (See Comments)   Mental reaction   Prednisone Nausea Only   Tequin [gatifloxacin] Hives   Latex Rash   Myrbetriq [mirabegron] Rash   Premarin [conjugated Estrogens] Rash      Medication List    TAKE these medications   acetaminophen 325 MG tablet Commonly known as: Tylenol Take 2 tablets (650 mg total) by mouth every 8 (eight) hours as needed for mild pain.   Advair Diskus 250-50 MCG/DOSE Aepb Generic drug: Fluticasone-Salmeterol Inhale 1 puff into the lungs in the morning and at bedtime.   albuterol (2.5 MG/3ML) 0.083% nebulizer solution Commonly known as: PROVENTIL Take 3 mLs (2.5 mg total) by nebulization every 6 (six) hours as needed for wheezing or shortness of breath. What changed: Another medication with the same name was changed. Make sure you understand how and when to take each.   ProAir HFA 108 (90 Base) MCG/ACT inhaler Generic drug: albuterol INHALE 2 PUFFS BY MOUTH EVERY 4 HOURS ASNEEDED FOR WHEEZING OR SHORTNESS OF BREATH What changed: See the new instructions.   ALPRAZolam 1 MG tablet Commonly known as: XANAX Take 1 mg by mouth 3 (three) times daily as needed for anxiety.   amoxicillin-clavulanate 400-57 MG/5ML suspension Commonly known as: AUGMENTIN Take 10.9 mLs (875 mg total) by mouth every 12 (twelve) hours for 7 days.   azithromycin 250 MG tablet Commonly known as: ZITHROMAX Take  250 mg by mouth every Monday, Wednesday, and Friday.   calcium carbonate 500 MG chewable tablet Commonly known as: TUMS - dosed in mg elemental calcium Chew 1 tablet by mouth as needed for indigestion or heartburn.   docusate sodium 100 MG capsule Commonly known as: Colace Take 1 capsule (100 mg total) by mouth 2 (two) times  daily as needed for up to 10 days for mild constipation.   ipratropium-albuterol 0.5-2.5 (3) MG/3ML Soln Commonly known as: DUONEB Take 3 mLs by nebulization every 4 (four) hours as needed. What changed: reasons to take this   metoprolol succinate 50 MG 24 hr tablet Commonly known as: TOPROL-XL Take 2 tablets (100 mg total) by mouth daily. Take with or immediately following a meal.   tiotropium 18 MCG inhalation capsule Commonly known as: SPIRIVA Place 18 mcg into inhaler and inhale daily.   traMADol 50 MG tablet Commonly known as: Ultram Take 1 tablet (50 mg total) by mouth every 6 (six) hours as needed.      Follow-up Information    Lysle Pearl, Griselle Rufer, DO Follow up in 2 week(s).   Specialty: Surgery Why: post op ventnral hernia repair Contact information: [address] [place] [address] [phone]            Total time spent arranging discharge was >29min. Signed: Benjamine Sprague 04/04/2019, 10:07 PM

## 2019-04-04 NOTE — Progress Notes (Signed)
Kylie [name]  A and O x 4 VSS. Pt tolerating diet well. No complaints of pain or nausea. IV removed intact, prescriptions given. Pt voices understanding of discharge instructions with no further questions. Pt discharged via wheelchair with axillary.   Allergies as of 04/04/2019      Reactions   Cephalexin Rash   Fish Allergy Hives   Ivp Dye [iodinated Diagnostic Agents] Other (See Comments), Anaphylaxis   Blood pressure drops very low eyes roll back in the back of her head cant breathe Shock, low BP   Nitrofurantoin Rash   Sulfa Antibiotics Hives, Rash   Aspirin Other (See Comments)   Stomach issues   Ciprofloxacin Hives   Doxycycline    Other Other (See Comments)   Myobetria/ dizzness   Oxycodone Other (See Comments)   Other Reaction: Not Assessed   Percocet [oxycodone-acetaminophen] Other (See Comments)   Mental reaction   Prednisone Nausea Only   Tequin [gatifloxacin] Hives   Latex Rash   Myrbetriq [mirabegron] Rash   Premarin [conjugated Estrogens] Rash      Medication List    TAKE these medications   acetaminophen 325 MG tablet Commonly known as: Tylenol Take 2 tablets (650 mg total) by mouth every 8 (eight) hours as needed for mild pain.   Advair Diskus 250-50 MCG/DOSE Aepb Generic drug: Fluticasone-Salmeterol Inhale 1 puff into the lungs in the morning and at bedtime.   albuterol (2.5 MG/3ML) 0.083% nebulizer solution Commonly known as: PROVENTIL Take 3 mLs (2.5 mg total) by nebulization every 6 (six) hours as needed for wheezing or shortness of breath. What changed: Another medication with the same name was changed. Make sure you understand how and when to take each.   ProAir HFA 108 (90 Base) MCG/ACT inhaler Generic drug: albuterol INHALE 2 PUFFS BY MOUTH EVERY 4 HOURS ASNEEDED FOR WHEEZING OR SHORTNESS OF BREATH What changed: See the new instructions.   ALPRAZolam 1 MG tablet Commonly known as: XANAX Take 1 mg by mouth 3 (three) times daily as needed for  anxiety.   amoxicillin-clavulanate 400-57 MG/5ML suspension Commonly known as: AUGMENTIN Take 10.9 mLs (875 mg total) by mouth every 12 (twelve) hours for 7 days.   azithromycin 250 MG tablet Commonly known as: ZITHROMAX Take 250 mg by mouth every Monday, Wednesday, and Friday.   calcium carbonate 500 MG chewable tablet Commonly known as: TUMS - dosed in mg elemental calcium Chew 1 tablet by mouth as needed for indigestion or heartburn.   docusate sodium 100 MG capsule Commonly known as: Colace Take 1 capsule (100 mg total) by mouth 2 (two) times daily as needed for up to 10 days for mild constipation.   ipratropium-albuterol 0.5-2.5 (3) MG/3ML Soln Commonly known as: DUONEB Take 3 mLs by nebulization every 4 (four) hours as needed. What changed: reasons to take this   metoprolol succinate 50 MG 24 hr tablet Commonly known as: TOPROL-XL Take 2 tablets (100 mg total) by mouth daily. Take with or immediately following a meal.   tiotropium 18 MCG inhalation capsule Commonly known as: SPIRIVA Place 18 mcg into inhaler and inhale daily.   traMADol 50 MG tablet Commonly known as: Ultram Take 1 tablet (50 mg total) by mouth every 6 (six) hours as needed.       Vitals:   04/04/19 0510 04/04/19 1221  BP: (!) 127/50 (!) 131/52  Pulse: 81 75  Resp: 18 20  Temp: 98.3 F (36.8 C) 98.4 F (36.9 C)  SpO2: 98% 100%  Cloretta Ned

## 2019-04-04 NOTE — Discharge Instructions (Signed)
Hernia repair, Care After This sheet gives you information about how to care for yourself after your procedure. Your health care provider may also give you more specific instructions. If you have problems or questions, contact your health care provider. What can I expect after the procedure? After your procedure, it is common to have the following: Pain in your abdomen, especially in the incision areas. You will be given medicine to control the pain. Tiredness. This is a normal part of the recovery process. Your energy level will return to normal over the next several weeks. Changes in your bowel movements, such as constipation or needing to go more often. Talk with your health care provider about how to manage this. Follow these instructions at home: Medicines  tylenol as needed for discomfort.      Use narcotics, if prescribed, only when tylenol is not enough to control pain.  325-650mg every 8hrs to max of 3000mg/24hrs (including the 325mg in every norco dose) for the tylenol.    Do not drive or use heavy machinery while taking prescription pain medicine. Do not drink alcohol while taking prescription pain medicine.  Incision care    Follow instructions from your health care provider about how to take care of your incision areas. Make sure you: Keep your incisions clean and dry. Wash your hands with soap and water before and after applying medicine to the areas, and before and after changing your bandage (dressing). If soap and water are not available, use hand sanitizer. Change your dressing as told by your health care provider. Leave stitches (sutures), skin glue, or adhesive strips in place. These skin closures may need to stay in place for 2 weeks or longer. If adhesive strip edges start to loosen and curl up, you may trim the loose edges. Do not remove adhesive strips completely unless your health care provider tells you to do that. Do not wear tight clothing over the incisions. Tight  clothing may rub and irritate the incision areas, which may cause the incisions to open. Do not take baths, swim, or use a hot tub until your health care provider approves. OK TO SHOWER IN 24HRS.   Check your incision area every day for signs of infection. Check for: More redness, swelling, or pain. More fluid or blood. Warmth. Pus or a bad smell. Activity Avoid lifting anything that is heavier than 10 lb (4.5 kg) for 2 weeks or until your health care provider says it is okay. No pushing/pulling greater than 30lbs You may resume normal activities as told by your health care provider. Ask your health care provider what activities are safe for you. Take rest breaks during the day as needed. Eating and drinking Follow instructions from your health care provider about what you can eat after surgery. To prevent or treat constipation while you are taking prescription pain medicine, your health care provider may recommend that you: Drink enough fluid to keep your urine clear or pale yellow. Take over-the-counter or prescription medicines. Eat foods that are high in fiber, such as fresh fruits and vegetables, whole grains, and beans. Limit foods that are high in fat and processed sugars, such as fried and sweet foods. General instructions Ask your health care provider when you will need an appointment to get your sutures or staples removed. Keep all follow-up visits as told by your health care provider. This is important. Contact a health care provider if: You have more redness, swelling, or pain around your incisions. You have more fluid   or blood coming from the incisions. Your incisions feel warm to the touch. You have pus or a bad smell coming from your incisions or your dressing. You have a fever. You have an incision that breaks open (edges not staying together) after sutures or staples have been removed. Get help right away if: You develop a rash. You have chest pain or difficulty  breathing. You have pain or swelling in your legs. You feel light-headed or you faint. Your abdomen swells (becomes distended). You have nausea or vomiting. You have blood in your stool (feces). This information is not intended to replace advice given to you by your health care provider. Make sure you discuss any questions you have with your health care provider. Document Released: 07/15/2004 Document Revised: 09/14/2017 Document Reviewed: 09/27/2015 Elsevier Interactive Patient Education  2019 Elsevier Inc.    

## 2019-06-22 ENCOUNTER — Emergency Department: Payer: Medicaid Other

## 2019-06-22 ENCOUNTER — Encounter: Payer: Self-pay | Admitting: Emergency Medicine

## 2019-06-22 ENCOUNTER — Other Ambulatory Visit: Payer: Self-pay

## 2019-06-22 ENCOUNTER — Emergency Department
Admission: EM | Admit: 2019-06-22 | Discharge: 2019-06-22 | Disposition: A | Discharge status: Home / Self Care | Payer: Medicaid Other | Attending: Emergency Medicine | Admitting: Emergency Medicine

## 2019-06-22 DIAGNOSIS — Z91040 Latex allergy status: Secondary | ICD-10-CM | POA: Diagnosis not present

## 2019-06-22 DIAGNOSIS — Z79899 Other long term (current) drug therapy: Secondary | ICD-10-CM | POA: Diagnosis not present

## 2019-06-22 DIAGNOSIS — J449 Chronic obstructive pulmonary disease, unspecified: Secondary | ICD-10-CM | POA: Diagnosis not present

## 2019-06-22 DIAGNOSIS — J1282 Pneumonia due to coronavirus disease 2019: Secondary | ICD-10-CM | POA: Diagnosis not present

## 2019-06-22 DIAGNOSIS — R509 Fever, unspecified: Secondary | ICD-10-CM | POA: Diagnosis present

## 2019-06-22 DIAGNOSIS — N183 Chronic kidney disease, stage 3 unspecified: Secondary | ICD-10-CM | POA: Diagnosis not present

## 2019-06-22 DIAGNOSIS — I129 Hypertensive chronic kidney disease with stage 1 through stage 4 chronic kidney disease, or unspecified chronic kidney disease: Secondary | ICD-10-CM | POA: Diagnosis not present

## 2019-06-22 DIAGNOSIS — Z87891 Personal history of nicotine dependence: Secondary | ICD-10-CM | POA: Insufficient documentation

## 2019-06-22 DIAGNOSIS — U071 COVID-19: Secondary | ICD-10-CM | POA: Diagnosis not present

## 2019-06-22 LAB — CBC
HCT: 42.7 % (ref 36.0–46.0)
Hemoglobin: 13.6 g/dL (ref 12.0–15.0)
MCH: 27.8 pg (ref 26.0–34.0)
MCHC: 31.9 g/dL (ref 30.0–36.0)
MCV: 87.3 fL (ref 80.0–100.0)
Platelets: 232 10*3/uL (ref 150–400)
RBC: 4.89 MIL/uL (ref 3.87–5.11)
RDW: 14.1 % (ref 11.5–15.5)
WBC: 7.5 10*3/uL (ref 4.0–10.5)
nRBC: 0 % (ref 0.0–0.2)

## 2019-06-22 LAB — BASIC METABOLIC PANEL
Anion gap: 10 (ref 5–15)
BUN: 28 mg/dL — ABNORMAL HIGH (ref 8–23)
CO2: 26 mmol/L (ref 22–32)
Calcium: 9.1 mg/dL (ref 8.9–10.3)
Chloride: 98 mmol/L (ref 98–111)
Creatinine, Ser: 1.67 mg/dL — ABNORMAL HIGH (ref 0.44–1.00)
GFR calc Af Amer: 38 mL/min — ABNORMAL LOW (ref >60)
GFR calc non Af Amer: 32 mL/min — ABNORMAL LOW (ref >60)
Glucose, Bld: 106 mg/dL — ABNORMAL HIGH (ref 70–99)
Potassium: 3.8 mmol/L (ref 3.5–5.1)
Sodium: 134 mmol/L — ABNORMAL LOW (ref 135–145)

## 2019-06-22 LAB — SARS CORONAVIRUS 2 BY RT PCR (HOSPITAL ORDER, PERFORMED IN ~~LOC~~ HOSPITAL LAB): SARS Coronavirus 2: POSITIVE — AB

## 2019-06-22 MED ORDER — KETOROLAC TROMETHAMINE 30 MG/ML IJ SOLN
15.0000 mg | Freq: Once | INTRAMUSCULAR | Status: AC
  Administered 2019-06-22: 15 mg via INTRAVENOUS
  Filled 2019-06-22: qty 1

## 2019-06-22 MED ORDER — SODIUM CHLORIDE 0.9 % IV SOLN
1000.0000 mL | Freq: Once | INTRAVENOUS | Status: AC
  Administered 2019-06-22: 1000 mL via INTRAVENOUS

## 2019-06-22 MED ORDER — ONDANSETRON HCL 4 MG/2ML IJ SOLN
4.0000 mg | Freq: Once | INTRAMUSCULAR | Status: AC
  Administered 2019-06-22: 4 mg via INTRAVENOUS
  Filled 2019-06-22: qty 2

## 2019-06-22 NOTE — ED Triage Notes (Signed)
Pt's husband is in ICU with Covid, pt states last 3 days she has had fever, increased SHOB and cough.  Pt has hx of COPD

## 2019-06-22 NOTE — ED Provider Notes (Addendum)
[facility] Emergency Department Provider Note   ____________________________________________    I have reviewed the triage vital signs and the nursing notes.   HISTORY  Chief Complaint Fever, Shortness of Breath, and Covid Exposure     HPI Kylie [name] is a 64 y.o. female with past medical history as detailed below including COPD, CKD, on home oxygen who presents with complaints of fever, body aches, fatigue, mild cough. She reports her husband is currently in the ICU with COVID-19. She has not been vaccinated. She does not take anything for this. Mild shortness of breath. No recent travel.  Past Medical History:  Diagnosis Date  . Anemia   . Anxiety    followed by Dr. Kasandra Knudsen  . Asthma   . 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 Chronic kidney disease, stage II (mild)   . CKD (chronic kidney disease) stage 3, GFR 30-59 ml/min   . COPD (chronic obstructive pulmonary disease) ([place])   . Depression    followed by Dr. Kasandra Knudsen  . Diverticulitis    hospitalized several times  . GERD (gastroesophageal reflux disease)   . Heart murmur   . Hiatal hernia   . Hypercholesteremia   . Hyperlipidemia   . Hypertension   . Leaky heart valve   . Low serum cortisol level ([place]) 06/03/2015  . Murmur   . Osteopenia determined by x-ray 03/27/2016  . Overactive bladder   . Panic attacks    followed by Dr. Kasandra Knudsen    Patient Active Problem List   Diagnosis Date Noted  . Abdominal pain 04/02/2019  . Ventral hernia without obstruction or gangrene 03/24/2019  . Incisional hernia with obstruction 03/22/2019  . Osteopenia determined by x-ray 03/27/2016  . COPD (chronic obstructive pulmonary disease) ([place]) 03/15/2016  . COPD exacerbation ([place]) 03/14/2016  . Medication monitoring encounter 03/07/2016  . Low serum cortisol level ([place]) 06/03/2015  . Vitamin B12 deficiency 06/03/2015  . Hyperlipidemia LDL goal <70 06/03/2015  . Abnormal weight  gain 05/20/2015  . Vitamin D deficiency 05/20/2015  . Breast cancer screening 05/20/2015  . IFG (impaired fasting glucose) 02/09/2015  . Striae 02/09/2015  . Elevated red blood cell count 02/09/2015  . Weight gain, abnormal 02/09/2015  . Unstable angina ([place]) 01/07/2015  . Angina at rest [facility]) 01/06/2015  . Vascular calcification 12/29/2014  . Heart murmur 12/29/2014  . Hx of angina pectoris 12/29/2014  . Facial numbness 12/29/2014  . Right arm numbness 12/29/2014  . Right leg numbness 12/29/2014  . Xanthelasma of eyelid 10/09/2014  . AVM (arteriovenous malformation) 09/16/2014  . Essential hypertension, benign 09/16/2014  . Onycholysis of toenail 09/16/2014  . Vaginal atrophy 09/16/2014  . Bilateral hand pain 09/16/2014  . Chronic kidney disease, stage II (mild)   . Hypercholesteremia   . Sigmoid diverticulitis 09/30/2013    Past Surgical History:  Procedure Laterality Date  . ABDOMINAL HYSTERECTOMY  2007   Total with BSO  . APPENDECTOMY  2006  . CARDIAC CATHETERIZATION N/A 01/07/2015   Procedure: Left Heart Cath and Coronary Angiography;  Surgeon: Corey Skains, MD;  Location: [facility];  Service: Cardiovascular;  Laterality: N/A;  . COLONOSCOPY  2013   Dr Acquanetta Sit at [facility]  . SIGMOID RESECTION / RECTOPEXY  10/17/13  . TUBAL LIGATION  1981  . UPPER GI ENDOSCOPY  2013   Dr Acquanetta Sit at [place]  . XI ROBOTIC ASSISTED VENTRAL HERNIA N/A 04/01/2019  Procedure: XI ROBOTIC ASSISTED VENTRAL HERNIA;  Surgeon: Sung Amabile, DO;  Location: [facility] OR[facility];  Service: General;  Laterality: N/A;    Prior to Admission medications   Medication Sig Start Date End Date Taking? Authorizing Provider  acetaminophen (TYLENOL) 500 MG tablet Take 500 mg by mouth every 6 (six) hours as needed.   Yes [provider]  albuterol (PROVENTIL) (2.5 MG/3ML) 0.083% nebulizer solution Take 3 mLs (2.5 mg total) by nebulization every 6 (six) hours as needed for wheezing or shortness  of breath. 03/27/16  Yes Lada, Janit Bern, MD  ALPRAZolam Prudy Feeler) 1 MG tablet Take 1 mg by mouth 3 (three) times daily as needed for anxiety.  09/02/13  Yes [provider]  azithromycin (ZITHROMAX) 500 MG tablet Take 0.5 tablets (250 mg total) by mouth Every Monday, Wednesday, and Friday.   Yes [provider]  Cholecalciferol 25 MCG (1000 UT) tablet Take by mouth. 05/27/19 05/26/20 Yes [provider]  cyanocobalamin 1000 MCG tablet Take 1,000 mcg by mouth daily.   Yes [provider]  ergocalciferol (VITAMIN D2) 1.25 MG (50000 UT) capsule Take 50,000 Units by mouth once a week.   Yes [provider]  Fluticasone-Salmeterol (ADVAIR DISKUS) 250-50 MCG/DOSE AEPB Inhale 1 puff into the lungs in the morning and at bedtime. 01/16/19  Yes [provider]  Fluticasone-Umeclidin-Vilant (TRELEGY ELLIPTA) 100-62.5-25 MCG/INH AEPB Inhale 1 puff into the lungs daily.   Yes [provider]  ipratropium-albuterol (DUONEB) 0.5-2.5 (3) MG/3ML SOLN Take 3 mLs by nebulization every 4 (four) hours as needed. Patient taking differently: Take 3 mLs by nebulization every 4 (four) hours as needed (shortness of breath).  07/06/16  Yes Merwyn Katos, MD  metoprolol succinate (TOPROL-XL) 100 MG 24 hr tablet Take 100 mg by mouth daily. Take with or immediately following a meal.   Yes [provider]  simvastatin (ZOCOR) 10 MG tablet Take 10 mg by mouth daily.   Yes [provider]  tiotropium (SPIRIVA) 18 MCG inhalation capsule Place 18 mcg into inhaler and inhale daily. 01/16/19 01/16/20 Yes [provider]  calcium carbonate (OS-CAL) 1250 (500 Ca) MG chewable tablet Chew by mouth.    [provider]  calcium carbonate (TUMS - DOSED IN MG ELEMENTAL CALCIUM) 500 MG chewable tablet Chew 1 tablet by mouth as needed for indigestion or heartburn.    [provider]  HYDROmorphone (DILAUDID) 2 MG tablet hydromorphone 2 mg tablet   TAKE 1 TABLET BY MOUTH EVERY 4 HOURS    [provider]  predniSONE (DELTASONE) 10 MG tablet prednisone 10 mg tablet    [provider]  PROAIR HFA 108 (90 Base) MCG/ACT inhaler INHALE 2 PUFFS BY MOUTH EVERY 4 HOURS ASNEEDED FOR WHEEZING OR SHORTNESS OF BREATH Patient taking differently: Inhale 2 puffs into the lungs every 4 (four) hours as needed for wheezing or shortness of breath.  05/15/16   Merwyn Katos, MD  traMADol (ULTRAM) 50 MG tablet Take 1 tablet (50 mg total) by mouth every 6 (six) hours as needed. 04/01/19 03/31/20  Sung Amabile, DO     Allergies Cephalexin, Fish allergy, Ivp dye [iodinated diagnostic agents], Nitrofurantoin, Sulfa antibiotics, Aspirin, Ciprofloxacin, Doxycycline, Other, Oxycodone, Percocet [oxycodone-acetaminophen], Prednisone, Tequin [gatifloxacin], Latex, Myrbetriq [mirabegron], and Premarin [conjugated estrogens]  Family History  Problem Relation Age of Onset  . Cancer Mother        colon  . Kidney disease Mother   . Arthritis Father   . Alcohol abuse Father   .  Mental illness Father   . Diverticulitis Father   . Asthma Sister   . Hyperlipidemia Sister   . Hypertension Sister   . Migraines Sister   . Heart disease Brother   . Hyperlipidemia Brother   . Hypertension Brother   . Stroke Brother   . Hypertension Daughter   . Hypertension Son   . Hypertension Sister   . Hypertension Sister   . Hypertension Sister   . Arthritis Sister   . Heart disease Sister   . Hypertension Sister   . Hypertension Brother   . Hyperlipidemia Brother   . Down syndrome Brother     Social History Social History   Tobacco Use  . Smoking status: Former Smoker    Packs/day: 2.00    Years: 25.00    Pack years: 50.00    Types: Cigarettes    Quit date: 01/10/2011    Years since quitting: 8.4  . Smokeless tobacco: Never Used  Vaping Use  . Vaping Use: Never used  Substance Use Topics  . Alcohol use: No    Alcohol/week: 0.0 standard drinks  .  Drug use: No    Review of Systems  Constitutional: As above Eyes: No visual changes.  ENT: No sore throat. Cardiovascular: Denies chest pain. Respiratory: As above Gastrointestinal: No abdominal pain.  Genitourinary: Negative for dysuria. Musculoskeletal: Negative for back pain. Skin: Negative for rash. Neurological: Negative for headaches    ____________________________________________   PHYSICAL EXAM:  VITAL SIGNS: ED Triage Vitals  Enc Vitals Group     BP 06/22/19 1115 (!) 155/70     Pulse Rate 06/22/19 1115 (!) 111     Resp 06/22/19 1115 (!) 22     Temp 06/22/19 1115 (!) 101.2 F (38.4 C)     Temp src --      SpO2 06/22/19 1115 96 %     Weight 06/22/19 1116 72.6 kg (160 lb)     Height 06/22/19 1116 1.473 m (4\' 10" )     Head Circumference --      Peak Flow --      Pain Score 06/22/19 1116 0     Pain Loc --      Pain Edu? --      Excl. in GC? --     Constitutional: Alert and oriented  Nose: No congestion/rhinnorhea. Mouth/Throat: Mucous membranes are moist.    Cardiovascular: Tachycardia regular rhythm. 06/24/19 peripheral circulation. Respiratory: Normal respiratory effort.  No retractions. Gastrointestinal: Soft and nontender. No distention.  No CVA tenderness.  Musculoskeletal: No lower extremity tenderness nor edema.  Warm and well perfused Neurologic:  Normal speech and language. No gross focal neurologic deficits are appreciated.  Skin:  Skin is warm, dry and intact. No rash noted. Psychiatric: Mood and affect are normal. Speech and behavior are normal.  ____________________________________________   LABS (all labs ordered are listed, but only abnormal results are displayed)  Labs Reviewed  SARS CORONAVIRUS 2 BY RT PCR (HOSPITAL ORDER, PERFORMED IN [facility]) - Abnormal; Notable for the following components:      Result Value   SARS Coronavirus 2 POSITIVE (*)    All other components within normal limits  BASIC METABOLIC PANEL -  Abnormal; Notable for the following components:   Sodium 134 (*)    Glucose, Bld 106 (*)    BUN 28 (*)    Creatinine, Ser 1.67 (*)    GFR calc non Af Amer 32 (*)    GFR calc  Af Amer 38 (*)    All other components within normal limits  CBC   ____________________________________________  EKG  ED ECG REPORT I, Jene Every, the attending physician, personally viewed and interpreted this ECG.  Date: 06/22/2019  Rhythm: Sinus tachycardia QRS Axis: normal Intervals: normal ST/T Wave abnormalities: normal Narrative Interpretation: no evidence of acute ischemia  ____________________________________________  RADIOLOGY  Chest x-ray reviewed by me, right lower lobe infiltrate ____________________________________________   PROCEDURES  Procedure(s) performed: No  Procedures   Critical Care performed: No ____________________________________________   INITIAL IMPRESSION / ASSESSMENT AND PLAN / ED COURSE  Pertinent labs & imaging results that were available during my care of the patient were reviewed by me and considered in my medical decision making (see chart for details).  Patient presents with fever, mild tachycardia, body aches, fatigue with spouse known positive with COVID-19. Symptoms ongoing for 3 to 4 days. Mild shortness of breath but satting well. On home oxygen. Has not taken anything for this.  Highly concerning for COVID-19 infection, bacterial pneumonia versus other upper respiratory infection is a possibility.  Chest x-ray reviewed by me likely infiltrate right lower lobe  COVID-19 test has returned positive.   strict return precautions discussed no indication for admission at this time.    ____________________________________________   FINAL CLINICAL IMPRESSION(S) / ED DIAGNOSES  Final diagnoses:  Pneumonia due to COVID-19 virus        Note:  This document was prepared using Dragon voice recognition software and may include unintentional  dictation errors.   Jene Every, MD 06/22/19 1334    Jene Every, MD 06/22/19 1335

## 2019-06-22 NOTE — ED Notes (Addendum)
E-signature not working at this time. Pt verbalized understanding of D/C instructions, prescriptions and follow up care with no further questions at this time. Pt in NAD and wheeled to lobby at time of D/C.  

## 2019-06-22 NOTE — ED Notes (Addendum)
Date and time results received: 06/22/19 1:09 PM  (use smartphrase ".now" to insert current time)  Test: Coronavirus Critical Value: Positive  Name of Provider Notified: Dr. Cyril Loosen

## 2019-06-23 ENCOUNTER — Telehealth: Payer: Self-pay | Admitting: Unknown Physician Specialty

## 2019-06-23 ENCOUNTER — Telehealth: Payer: Self-pay | Admitting: Adult Health

## 2019-06-23 NOTE — Telephone Encounter (Signed)
Called to discuss with patient about Covid symptoms and the use of bamlanivimab, a monoclonal antibody infusion for those with mild to moderate Covid symptoms and at a high risk of hospitalization.  Pt is qualified for this infusion at the [facility] infusion center due to COPD and other comorbid conditions   Message left to call back

## 2019-06-23 NOTE — Telephone Encounter (Signed)
Called and LMOM to discuss her eligibility for monoclonal antibody treatment for patients who are COVID positive and are at increased risk for hospitalization and/or complications from COVID19.  Call back number given.    Lillard Anes, NP

## 2019-06-24 ENCOUNTER — Other Ambulatory Visit: Payer: Self-pay | Admitting: Surgery

## 2019-06-24 DIAGNOSIS — R1084 Generalized abdominal pain: Secondary | ICD-10-CM

## 2019-07-02 ENCOUNTER — Ambulatory Visit: Payer: Medicaid Other

## 2019-07-08 ENCOUNTER — Ambulatory Visit
Admission: RE | Admit: 2019-07-08 | Discharge: 2019-07-08 | Disposition: A | Discharge status: Home / Self Care | Payer: Medicaid Other | Source: Ambulatory Visit | Attending: Surgery | Admitting: Surgery

## 2019-07-08 ENCOUNTER — Other Ambulatory Visit: Payer: Self-pay

## 2019-07-08 DIAGNOSIS — R1084 Generalized abdominal pain: Secondary | ICD-10-CM | POA: Diagnosis not present
